# Patient Record
Sex: Male | Born: 1965 | Race: White | Hispanic: No | Marital: Married | Smoking: Former smoker
Health system: Southern US, Community
[De-identification: ages and names within clinical notes are randomized; demographics above are authoritative.]

## PROBLEM LIST (undated history)

## (undated) DIAGNOSIS — I10 Essential (primary) hypertension: Secondary | ICD-10-CM

## (undated) DIAGNOSIS — N189 Chronic kidney disease, unspecified: Secondary | ICD-10-CM

## (undated) DIAGNOSIS — T7840XA Allergy, unspecified, initial encounter: Secondary | ICD-10-CM

## (undated) DIAGNOSIS — M1990 Unspecified osteoarthritis, unspecified site: Secondary | ICD-10-CM

## (undated) DIAGNOSIS — I499 Cardiac arrhythmia, unspecified: Secondary | ICD-10-CM

## (undated) HISTORY — DX: Unspecified osteoarthritis, unspecified site: M19.90

## (undated) HISTORY — DX: Chronic kidney disease, unspecified: N18.9

## (undated) HISTORY — PX: CYSTOSCOPY: SUR368

## (undated) HISTORY — DX: Allergy, unspecified, initial encounter: T78.40XA

## (undated) HISTORY — PX: LITHOTRIPSY: SUR834

---

## 1998-08-13 ENCOUNTER — Emergency Department (HOSPITAL_COMMUNITY): Admission: EM | Admit: 1998-08-13 | Discharge: 1998-08-13 | Payer: Self-pay | Admitting: *Deleted

## 1999-01-15 ENCOUNTER — Encounter: Payer: Self-pay | Admitting: Emergency Medicine

## 1999-01-15 ENCOUNTER — Emergency Department (HOSPITAL_COMMUNITY): Admission: EM | Admit: 1999-01-15 | Discharge: 1999-01-15 | Payer: Self-pay | Admitting: Emergency Medicine

## 2000-01-01 ENCOUNTER — Ambulatory Visit (HOSPITAL_COMMUNITY): Admission: RE | Admit: 2000-01-01 | Discharge: 2000-01-01 | Payer: Self-pay | Admitting: Urology

## 2000-01-01 ENCOUNTER — Encounter: Payer: Self-pay | Admitting: Urology

## 2002-08-14 ENCOUNTER — Emergency Department (HOSPITAL_COMMUNITY): Admission: EM | Admit: 2002-08-14 | Discharge: 2002-08-15 | Payer: Self-pay | Admitting: Emergency Medicine

## 2002-08-19 ENCOUNTER — Encounter: Admission: RE | Admit: 2002-08-19 | Discharge: 2002-08-19 | Payer: Self-pay | Admitting: Urology

## 2002-08-19 ENCOUNTER — Encounter: Payer: Self-pay | Admitting: Urology

## 2002-08-22 ENCOUNTER — Encounter: Payer: Self-pay | Admitting: Emergency Medicine

## 2002-08-22 ENCOUNTER — Emergency Department (HOSPITAL_COMMUNITY): Admission: EM | Admit: 2002-08-22 | Discharge: 2002-08-22 | Payer: Self-pay | Admitting: Emergency Medicine

## 2002-10-09 ENCOUNTER — Encounter: Admission: RE | Admit: 2002-10-09 | Discharge: 2002-10-09 | Payer: Self-pay | Admitting: Urology

## 2002-10-09 ENCOUNTER — Encounter: Payer: Self-pay | Admitting: Urology

## 2002-10-13 ENCOUNTER — Ambulatory Visit (HOSPITAL_COMMUNITY): Admission: RE | Admit: 2002-10-13 | Discharge: 2002-10-13 | Payer: Self-pay | Admitting: Urology

## 2004-07-19 ENCOUNTER — Emergency Department (HOSPITAL_COMMUNITY): Admission: EM | Admit: 2004-07-19 | Discharge: 2004-07-19 | Payer: Self-pay | Admitting: Emergency Medicine

## 2006-05-16 ENCOUNTER — Emergency Department (HOSPITAL_COMMUNITY): Admission: EM | Admit: 2006-05-16 | Discharge: 2006-05-16 | Payer: Self-pay | Admitting: Emergency Medicine

## 2006-10-04 ENCOUNTER — Emergency Department (HOSPITAL_COMMUNITY): Admission: EM | Admit: 2006-10-04 | Discharge: 2006-10-04 | Payer: Self-pay | Admitting: Emergency Medicine

## 2007-07-21 ENCOUNTER — Emergency Department: Payer: Self-pay | Admitting: Emergency Medicine

## 2007-08-04 ENCOUNTER — Emergency Department (HOSPITAL_COMMUNITY): Admission: EM | Admit: 2007-08-04 | Discharge: 2007-08-04 | Payer: Self-pay | Admitting: Emergency Medicine

## 2010-09-11 ENCOUNTER — Emergency Department: Payer: Self-pay | Admitting: Emergency Medicine

## 2011-03-16 NOTE — Op Note (Signed)
[place]  Patient:    Gordon Stark, Gordon Stark                       MRN: [id number] Proc. Date: 01/01/00 Adm. Date:  [id number] Attending:  Lauree Chandler                           Operative Report  PREOPERATIVE DIAGNOSIS:  Distal right ureteral stone.  POSTOPERATIVE DIAGNOSIS:  Distal right ureteral stone.  PROCEDURE:  Cystoscopy, right ureteroscopic stone manipulation, right retrograde pyelogram with interpretation, insertion of right double-J catheter.  SURGEON:  Maretta Bees. Vonita Moss, M.D.  ANESTHESIA:  General.  INDICATIONS:  This is a 45 year old gentleman, who had past history of stones and now has had recurrent right flank pain for several weeks.  Because of his persistent symptomatology, he wanted [place] to go ahead and intervene.  He had an IVP on December 27, 1999, which showed mild caliectasis and what appeared to be a faint 3-4 mm stone in the mid bony pelvis.  Preoperative x-ray today was read as negative by the radiologist, but I looked at the x-ray and found faint opacity that I thought equated to the stone I saw on x-ray before.  PROCEDURE:  The patient was brought to the operating room and placed in the lithotomy position.  External genitalia were prepped and draped in usual fashion. He was cystoscoped and the bladder was unremarkable.  I passed a guidewire up the right ureter and fluoroscopically could see the stone adjacent to the wire and moving with the wire.  I then did a balloon dilation of the intramural ureter with a guidewire in place, I inserted the 6-French short, rigid ureteroscope and was  able to visualize the stone.  It was fairly impacted.  On the third attempt to grasp the stone with the [facility] stone basket, I was successful and removed the stone intact and it correlated with the 3-4 mm suspected calcification on KUB and I later gave the stone to the family.  I then inserted a ureteral catheter and performed  a right retrograde pyelogram that showed the ureter to be intact with  some mild caliectasis.  I then inserted a 6-French/26 cm double-J catheter with a full coil in the renal pelvis and full coil in the bladder and the string brought out per urethra.  The string was taped to the penis and the patient sent to the  recovery room in good condition, having tolerated the procedure well. DD:  01/01/00 TD:  01/01/00 Job: [id number]

## 2011-03-16 NOTE — Op Note (Signed)
Gordon Stark, Gordon Stark                          ACCOUNT NO.:  [id number]   MEDICAL RECORD NO.:  [id number]                   PATIENT TYPE:  AMB   LOCATION:  DAY                                  FACILITY:  [facility]   PHYSICIAN:  Maretta Bees. Vonita Moss, M.D.             DATE OF BIRTH:  21-Nov-1965   DATE OF PROCEDURE:  10/28/2002  DATE OF DISCHARGE:                                 OPERATIVE REPORT   PREOPERATIVE DIAGNOSIS:  Distal left ureteral calculus.   POSTOPERATIVE DIAGNOSIS:  Distal left ureteral calculus.   PROCEDURE:  Cystoscopy  left retrograde pyelogram interpretation and left  ureteroscopic basketing and removal.   SURGEON:  Maretta Bees. Vonita Moss, M.D.   ANESTHESIA:  General.   INDICATIONS:  This 45 year old white male has had over six weeks of pain due  to a stone in the left ureter that started out at the [facility] and finally had a  recent CAT scan and it was down in the distal left ureter.  The stone on the  recent CT scan measured 2 mm.  The stone on the recent CT scan measured 2  mm.  There was also a tiny lower pole calculi in each kidney.  Because of  persistent symptoms, the patient wished endoscopic removal.   DESCRIPTION OF PROCEDURE:  The patient was brought to the operating room and  placed in the lithotomy position.  External genitalia were prepped and  draped in the usual fashion.  With the cystoscope, the bladder was normal.  The guidewire was easily passed up the left ureter.  I inserted the 6 French  rigid ureteroscope just inside the ureteral orifice and then there was some  snugness in the ureter and therefore I used a ureteral dilating sheath to  dilate the intramural ureter which then allowed the ureteroscope to easily  pass under visual control, and I encountered the stone fragment which was  then broken in two pieces and each piece was retrieved with the Nitinol  stone basket.  They were later retrieved from the bladder and given to the  patient.  In the  meantime, the distal ureter was rescoped, and there was no  evidence of residual stone fragment and no evidence of perforation or any  significant injury to the distal ureter.  I injected contrast in the upper  ureter.  It was intact with no obstruction.  I elected not to put in a  double-J catheter at this time.  As mentioned above, we then placed the  scope and removed the stones and drained the bladder.  He was given 30 mg of  Toradol, B&O suppository and Xylocaine jelly was injected per urethra for  postoperative analgesia.  He was then taken to the recovery room in good  condition.  Maretta Bees. Vonita Moss, M.D.    LJP/MEDQ  D:  10/13/2002  T:  10/13/2002  Job:  [id number]

## 2012-03-18 ENCOUNTER — Other Ambulatory Visit: Payer: Self-pay | Admitting: Urology

## 2012-04-16 ENCOUNTER — Encounter (HOSPITAL_COMMUNITY): Payer: Self-pay | Admitting: Pharmacy Technician

## 2012-04-21 ENCOUNTER — Encounter (HOSPITAL_COMMUNITY): Payer: Self-pay | Admitting: *Deleted

## 2012-04-22 ENCOUNTER — Encounter (HOSPITAL_COMMUNITY): Payer: Self-pay | Admitting: *Deleted

## 2012-04-23 NOTE — H&P (Signed)
History of Present Illness         F/u nephrolithiasis. Pt presented Feb 2013 with acute right flank pain. He has known multiple bilateral renal stones as documented on CTU 2010.  He passed a small stone spontaneously 03/2011.  He has required stone extraction procedures and ESWL in the past.  Interval Hx  CT of the abdomen and pelvis February 2013 revealed a 1 cm right lower pole stone and a 7 mm left midpole and a 2 mm left lower pole stone. There was no hydroureteronephrosis.  Patient continued surveillance and returns today. He has been considering shock wave lithotripsy.   He feels he is passing a stone. He has left flank pain. His UA shows TNTC rbc's.  KUB today - normal bowel gas. Comparison to CT Feb 2013, 1 cm RLP stone, 7 mm LMP stone. No stone in area of ureter or bladder.   Past Medical History Problems  1. History of  Esophageal Reflux 530.81 2. History of  Hypertension 401.9 3. History of  Rhythm Disorder 427.9 4. History of  Rosacea 695.3  Surgical History Problems  1. History of  Cystoscopy With Ureteroscopy 2. History of  Lithotripsy  Current Meds 1. Chlorthalidone 25 MG Oral Tablet; Therapy: 11Nov2012 to 2. Doxycycline Monohydrate CAPS; Therapy: (Recorded:13May2010) to 3. Oxycodone-Acetaminophen 5-325 MG Oral Tablet; Take 1 to 2 tabs q 3 to 4  hours prn pain;  Therapy: 13May2010 to (Last Rx:01Feb2013) 4. Pepcid TABS; Therapy: (Recorded:13May2010) to  Allergies Medication  1. Salicylates  Family History Problems  1. Family history of  Nephrolithiasis  Social History Problems  1. Alcohol Use rarely 2. Marital History - Currently Married 3. Occupation: Research officer, political party 4. History of  Tobacco Use V15.82 Smoked on weekends only and quit 2 yrs ago Denied  5. History of  Caffeine Use  Review of Systems Constitutional, skin, eye, otolaryngeal, hematologic/lymphatic, cardiovascular, pulmonary, endocrine, musculoskeletal, gastrointestinal, neurological and  psychiatric system(s) were reviewed and pertinent findings if present are noted.    Vitals Vital Signs [Data Includes: Last 1 Day]  20May2013 03:50PM  Blood Pressure: 125 / 90 Temperature: 98.2 F Heart Rate: 87  Physical Exam Constitutional: Well nourished and well developed . No acute distress.  Pulmonary: No respiratory distress and normal respiratory rhythm and effort.  Cardiovascular: Heart rate and rhythm are normal . No peripheral edema.  Neuro/Psych:. Mood and affect are appropriate.    Results/Data Urine [Data Includes: Last 1 Day]   20May2013  COLOR YELLOW   APPEARANCE CLEAR   SPECIFIC GRAVITY 1.025   pH 5.5   GLUCOSE NEG mg/dL  BILIRUBIN NEG   KETONE NEG mg/dL  BLOOD LARGE   PROTEIN NEG mg/dL  UROBILINOGEN 0.2 mg/dL  NITRITE NEG   LEUKOCYTE ESTERASE NEG   SQUAMOUS EPITHELIAL/HPF RARE   WBC NONE SEEN WBC/hpf  RBC TNTC RBC/hpf  BACTERIA RARE   CRYSTALS NONE SEEN   CASTS NONE SEEN    Assessment Assessed  1. 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 Hydrocodone-Acetaminophen 5-325 MG Oral Tablet; TAKE 1 TO 2 TABLETS EVERY 4 TO 6  HOURS AS NEEDED FOR PAIN.  NO MORE THAN 8 TABLETS PER DAY; Therapy: 20May2013 to  (Evaluate:25May2013); Last Rx:20May2013 2. Tamsulosin HCl 0.4 MG Oral Capsule; TAKE 1 CAPSULE Daily; Therapy: 20May2013 to  (Evaluate:17Sep2013); Last Rx:20May2013 3. KUB  Done: 20May2013 12:00AM 4. Follow-up Schedule Surgery Office  Follow-up  Requested for: 20May2013  Discussion/Summary  He's likely passing one of the small LLP stones seen on CT. He will restart tamsulosin. He was given pain meds.   We discussed the larger stones are relatively stable. We discussed the nature, R/B of continued surveillance, ESWL , URS or PCNL.   The patient is concerned because he has a 45 yo at home and his wife works at night. He has his son by himself. Also they want to do some traveling and he is concerned  about passing a stone. He doesnt want to have to pass the stone. He elects to proceed with ESWL and start with the right. We discussed this is a large stone and he may need multiple procedures to clear it and/or the fragments.       Signatures Electronically signed by : Jerilee Field, M.D.; Mar 17 2012  4:55PM

## 2012-04-24 ENCOUNTER — Ambulatory Visit (HOSPITAL_COMMUNITY): Payer: Federal, State, Local not specified - PPO

## 2012-04-24 ENCOUNTER — Encounter (HOSPITAL_COMMUNITY): Payer: Self-pay | Admitting: *Deleted

## 2012-04-24 ENCOUNTER — Encounter (HOSPITAL_COMMUNITY)
Admission: RE | Disposition: A | Discharge status: Home / Self Care | Payer: Self-pay | Source: Ambulatory Visit | Attending: Urology

## 2012-04-24 ENCOUNTER — Ambulatory Visit (HOSPITAL_COMMUNITY)
Admission: RE | Admit: 2012-04-24 | Discharge: 2012-04-24 | Disposition: A | Discharge status: Home / Self Care | Payer: Federal, State, Local not specified - PPO | Source: Ambulatory Visit | Attending: Urology | Admitting: Urology

## 2012-04-24 DIAGNOSIS — Z79899 Other long term (current) drug therapy: Secondary | ICD-10-CM | POA: Insufficient documentation

## 2012-04-24 DIAGNOSIS — I10 Essential (primary) hypertension: Secondary | ICD-10-CM | POA: Insufficient documentation

## 2012-04-24 DIAGNOSIS — I499 Cardiac arrhythmia, unspecified: Secondary | ICD-10-CM | POA: Insufficient documentation

## 2012-04-24 DIAGNOSIS — K219 Gastro-esophageal reflux disease without esophagitis: Secondary | ICD-10-CM | POA: Insufficient documentation

## 2012-04-24 DIAGNOSIS — N200 Calculus of kidney: Secondary | ICD-10-CM | POA: Insufficient documentation

## 2012-04-24 HISTORY — DX: Cardiac arrhythmia, unspecified: I49.9

## 2012-04-24 HISTORY — DX: Essential (primary) hypertension: I10

## 2012-04-24 SURGERY — LITHOTRIPSY, ESWL
Anesthesia: LOCAL | Laterality: Right

## 2012-04-24 MED ORDER — CIPROFLOXACIN HCL 500 MG PO TABS
500.0000 mg | ORAL_TABLET | ORAL | Status: AC
Start: 2012-04-24 — End: 2012-04-24
  Administered 2012-04-24: 500 mg via ORAL
  Filled 2012-04-24: qty 1

## 2012-04-24 MED ORDER — CIPROFLOXACIN HCL 500 MG PO TABS: 250.0000 mg | ORAL_TABLET | Freq: Two times a day (BID) | ORAL | Status: AC

## 2012-04-24 MED ORDER — OXYCODONE-ACETAMINOPHEN 5-325 MG PO TABS: 1.0000 | ORAL_TABLET | ORAL | Status: AC | PRN

## 2012-04-24 MED ORDER — DIAZEPAM 5 MG PO TABS
10.0000 mg | ORAL_TABLET | ORAL | Status: AC
  Administered 2012-04-24: 10 mg via ORAL

## 2012-04-24 MED ORDER — DIPHENHYDRAMINE HCL 25 MG PO CAPS
25.0000 mg | ORAL_CAPSULE | ORAL | Status: AC
  Administered 2012-04-24: 25 mg via ORAL

## 2012-04-24 MED ORDER — DEXTROSE-NACL 5-0.45 % IV SOLN
INTRAVENOUS | Status: DC
  Administered 2012-04-24: 11:00:00 via INTRAVENOUS

## 2012-04-24 NOTE — Progress Notes (Signed)
Pt denies aspirin usage last 3 days

## 2012-04-24 NOTE — Brief Op Note (Signed)
04/24/2012  1:20 PM  PATIENT:  Gordon Stark  46 y.o. male  PRE-OPERATIVE DIAGNOSIS:  Right Nephrolithiasis  POST-OPERATIVE DIAGNOSIS:  Right nephrolithiasis  PROCEDURE:  Procedure(s) (LRB): EXTRACORPOREAL SHOCK WAVE LITHOTRIPSY (ESWL) (Right)  SURGEON:  Surgeon(s) and Role:    * Antony Haste, MD - Primary   PLAN OF CARE: Discharge to home after PACU  PATIENT DISPOSITION:  PACU - hemodynamically stable.

## 2012-04-24 NOTE — Interval H&P Note (Signed)
History and Physical Interval Note:  04/24/2012 12:55 PM  Gordon Stark  has presented today for surgery, with the diagnosis of Right Nephrolithiasis  The various methods of treatment have been discussed with the patient and family. After consideration of risks, benefits and other options for treatment, the patient has consented to  Procedure(s) (LRB): EXTRACORPOREAL SHOCK WAVE LITHOTRIPSY (ESWL) (Right) as a surgical intervention .  The patient's history has been reviewed, patient examined, no change in status, stable for surgery.  I have reviewed the patients' chart and labs.  Questions were answered to the patient's satisfaction.     Antony Haste

## 2012-04-24 NOTE — Discharge Instructions (Signed)
Lithotripsy for Kidney Stones  WHAT ARE KIDNEY STONES?  The kidneys filter blood for chemicals the body cannot use. These waste chemicals are eliminated in the urine. They are removed from the body. Under some conditions, these chemicals may become concentrated. When this happens, they form crystals in the urine. When these crystals build up and stick together, stones may form. When these stones block the flow of urine through the urinary tract, they may cause severe pain. The urinary tract is very sensitive to blockage and stretching by the stone.  WHAT IS LITHOTRIPSY?  Lithotripsy is a treatment that can sometimes help eliminate kidney stones and pain faster. A form of lithotripsy, also known as ESWL (extracorporeal shock wave lithotripsy), is a nonsurgical procedure that helps your body rid itself of the kidney stone with a minimum amount of pain. EWSL is a method of crushing a kidney stone with shock waves. These shock waves pass through your body. They cause the kidney stones to crumble while still in the urinary tract. It is then easier for the smaller pieces of stone to pass in the urine.  Lithotripsy usually takes about an hour. It is done in a hospital, a lithotripsy center, or a mobile unit. It usually does not require an overnight stay. Your caregiver will instruct you on preparation for the procedure. Your caregiver will tell you what to expect afterward.  LET YOUR CAREGIVER KNOW ABOUT:   Allergies.    Medicines taken including herbs, eye drops, over the counter medicines (including aspirin, aleve, or motrin for treatment of inflammatory conditions) and creams.    Use of steroids (by mouth or creams).    Previous problems with anesthetics or novocaine.    Possibility of pregnancy, if this applies.    History of blood clots (thrombophlebitis).    History of bleeding or blood problems.    Previous surgery.    Other health problems.   RISKS AND COMPLICATIONS   Complications of lithotripsy are uncommon, but include the following:   Infection.    Bleeding of the kidney.    Bruising of the kidney or skin.    Obstruction of the ureter (the passageway from the kidney to the bladder).    Failure of the stone to fragment (break apart).   PROCEDURE  A stent (flexible tube with holes) may be placed in your ureter. The ureter is the tube that transports the urine from the kidneys to the bladder. Your caregiver may place a stent before the procedure. This will help keep urine flowing from the kidney if the fragments of the stone block the ureter. You may receive an intravenous (IV) line to give you fluids and medicines. These medicines may help you relax or make you sleep. During the procedure, you will lie comfortably on a fluid-filled cushion or in a warm-water bath. After an x-ray or ultrasound locates your stone, shock waves are aimed at the stone. If you are awake, you may feel a tapping sensation (feeling) as the shock waves pass through your body. If large stone particles remain after treatment, a second procedure may be necessary at a later date.  For comfort during the test:   Relax as much as possible.    Try to remain still as much as possible.    Try to follow instructions to speed up the test.    Let your caregiver know if you are uncomfortable, anxious, or in pain.   AFTER THE PROCEDURE    After surgery, you will be   taken to the recovery area. A nurse will watch and check your progress. Once you're awake, stable, and taking fluids well, you will be allowed to go home as long as there are no problems. You may be prescribed antibiotics (medicines that kill germs) to help prevent infection. You may also be prescribed pain medicine if needed. In a week or two, your doctor may remove your stent, if you have one. Your caregiver will check to see whether or not stone particles remain.  PASSING THE STONE   It may take anywhere from a day to several weeks for the stone particles to leave your body. During this time, drink at least 8 to 12 eight ounce glasses of water every day. It is normal for your urine to be cloudy or slightly bloody for a few weeks following this procedure. You may even see small pieces of stone in your urine. A slight fever and some pain are also normal. Your caregiver may ask you to strain your urine to collect some stone particles for chemical analysis. If you find particles while straining the urine, save them. Analysis tells you and the caregiver what the stone is made of. Knowing this may help prevent future stones.  PREVENTING FUTURE STONES   Drink about 8 to 12, eight-ounce glasses of water every day.    Follow the diet your caregiver recommends.    Take your prescribed medicine.    See your caregiver regularly for checkups.   SEEK IMMEDIATE MEDICAL CARE IF:   You develop an oral temperature above 102 F (38.9 C), or as your caregiver suggests.    Your pain is not relieved by medicine.    You develop nausea (feeling sick to your stomach) and vomiting.    You develop heavy bleeding.    You have difficulty urinating.   Document Released: 10/12/2000 Document Revised: 10/04/2011 Document Reviewed: 08/06/2008  [employer] Patient Information 2012 [employer].

## 2012-04-25 ENCOUNTER — Encounter (HOSPITAL_COMMUNITY): Payer: Self-pay

## 2012-06-06 ENCOUNTER — Other Ambulatory Visit: Payer: Self-pay | Admitting: Family Medicine

## 2012-07-18 ENCOUNTER — Telehealth: Payer: Self-pay

## 2012-07-18 MED ORDER — CHLORTHALIDONE 25 MG PO TABS: 25.0000 mg | ORAL_TABLET | Freq: Every day | ORAL | Status: DC

## 2012-07-18 NOTE — Telephone Encounter (Signed)
PT NEEDS TO COME IN FOR A REFILL ON HIS BP MED, BUT CANT COME IN UNTIL NEXT Thursday.  HE WANTS TO KNOW IF HE CAN GET A WEEKS WORTH OF HIS BP MEDICINE UNTIL THEN.  [phone]

## 2012-07-18 NOTE — Telephone Encounter (Signed)
Called pt to advise, he is coming in next week. Rx sent in for him.

## 2012-12-03 ENCOUNTER — Ambulatory Visit: Payer: PRIVATE HEALTH INSURANCE

## 2012-12-03 ENCOUNTER — Ambulatory Visit (INDEPENDENT_AMBULATORY_CARE_PROVIDER_SITE_OTHER): Payer: PRIVATE HEALTH INSURANCE | Admitting: Family Medicine

## 2012-12-03 ENCOUNTER — Other Ambulatory Visit: Payer: Self-pay | Admitting: Family Medicine

## 2012-12-03 VITALS — BP 141/94 | HR 66 | Temp 99.3°F | Resp 16 | Ht 70.5 in | Wt 265.0 lb

## 2012-12-03 DIAGNOSIS — R112 Nausea with vomiting, unspecified: Secondary | ICD-10-CM

## 2012-12-03 DIAGNOSIS — I10 Essential (primary) hypertension: Secondary | ICD-10-CM

## 2012-12-03 LAB — COMPREHENSIVE METABOLIC PANEL
Albumin: 4 g/dL (ref 3.5–5.2)
Alkaline Phosphatase: 57 U/L (ref 39–117)
Glucose, Bld: 105 mg/dL — ABNORMAL HIGH (ref 70–99)
Potassium: 3.7 mEq/L (ref 3.5–5.3)
Sodium: 138 mEq/L (ref 135–145)
Total Protein: 6.5 g/dL (ref 6.0–8.3)

## 2012-12-03 LAB — POCT CBC
Granulocyte percent: 75.1 %G (ref 37–80)
HCT, POC: 47.1 % (ref 43.5–53.7)
Hemoglobin: 15.5 g/dL (ref 14.1–18.1)
POC Granulocyte: 4.2 (ref 2–6.9)
POC LYMPH PERCENT: 18.2 %L (ref 10–50)
RDW, POC: 13.4 %

## 2012-12-03 MED ORDER — ONDANSETRON 4 MG PO TBDP
8.0000 mg | ORAL_TABLET | Freq: Once | ORAL | Status: AC
  Administered 2012-12-03: 8 mg via ORAL

## 2012-12-03 MED ORDER — ONDANSETRON HCL 8 MG PO TABS: 8.0000 mg | ORAL_TABLET | Freq: Three times a day (TID) | ORAL | Status: DC | PRN

## 2012-12-03 MED ORDER — CHLORTHALIDONE 25 MG PO TABS: 25.0000 mg | ORAL_TABLET | Freq: Every day | ORAL | Status: DC

## 2012-12-03 NOTE — Progress Notes (Signed)
[facility] [address] [place] [address] [phone]  Date:  12/03/2012   Name:  Gordon Stark   DOB:  [birth year]   MRN:  [id number]  PCP:  Abbe Amsterdam, MD    Chief Complaint: Abdominal Pain, Diarrhea and Emesis   History of Present Illness:  Gordon Stark is a 47 y.o. very pleasant male patient who presents with the following:  He is here today with illness- at about 0130 yesterday am he noted onset of chills and diarrhea.  He then started to vomit a few hours later.  The vomiting stopped about noon yesterday, as did the diarrhea.  He tried imodium.  He does note epigastric abdominal pain.  He vomited about 6 times overall- there was a small amount of bloody mucus towards the end.  He has tried to eat a few crackers, but continues to have pain.   He had felt warm yesterday at home, but they did not check a temperature.  His 52 year old son was ill with a similar problem last week, and some friends who he golfed with over the weekend had a GI bug as well earlier this week.  No cough, ST or earache.    He has been out of his chlorthalidone for a couple of months and needs a refill  There is no problem list on file for this patient.   Past Medical History  Diagnosis Date  . Hypertension   . Dysrhythmia   . Allergy     Past Surgical History  Procedure Date  . Lithotripsy     75yrs ago  . Cystoscopy     6 times    History  Substance Use Topics  . Smoking status: Never Smoker   . Smokeless tobacco: Current User    Types: Chew  . Alcohol Use: Yes     Comment: 12 pack beer a week    No family history on file.  Allergies  Allergen Reactions  . Salicylates Other (See Comments)    Lips swell so bad they can bust    Medication list has been reviewed and updated.  Current Outpatient Prescriptions on File Prior to Visit  Medication Sig Dispense Refill  . diphenhydrAMINE (BENADRYL) 25 MG tablet Take 25 mg by mouth 2 (two) times  daily.      Marland Kitchen doxycycline (VIBRA-TABS) 100 MG tablet Take 100 mg by mouth 2 (two) times daily.      . famotidine (PEPCID) 20 MG tablet Take 20 mg by mouth 2 (two) times daily.      . chlorthalidone (HYGROTON) 25 MG tablet Take 1 tablet (25 mg total) by mouth daily. NEEDS OFFICE VISIT/LABS FOR MORE  30 tablet  0  . HYDROcodone-acetaminophen (NORCO) 5-325 MG per tablet Take 1-2 tablets by mouth See admin instructions. Every 4-6 hours max of 8 tabs per day        Review of Systems:  As per HPI- otherwise negative.   Physical Examination: Filed Vitals:   12/03/12 0759  BP: 141/94  Pulse: 66  Temp: 99.3 F (37.4 C)  Resp: 16   Filed Vitals:   12/03/12 0759  Height: 5' 10.5" (1.791 m)  Weight: 265 lb (120.203 kg)   Body mass index is 37.49 kg/(m^2). Ideal Body Weight: Weight in (lb) to have BMI = 25: 176.4   GEN: WDWN, NAD, Non-toxic, A & O x 3, overweight HEENT: Atraumatic, Normocephalic. Neck supple. No masses, No LAD.  Bilateral TM wnl,  oropharynx normal.  PEERL,EOMI.   Ears and Nose: No external deformity. CV: RRR, No M/G/R. No JVD. No thrill. No extra heart sounds. PULM: CTA B, no wheezes, crackles, rhonchi. No retractions. No resp. distress. No accessory muscle use. ABD: S, ND, +BS. No rebound. No HSM. Mild tenderenss throughout the abdomen- not in any particular quadrant.  It moves around on re- exam.  No particular RLQ tenderness EXTR: No c/c/e NEURO Normal gait.  PSYCH: Normally interactive. Conversant. Not depressed or anxious appearing.  Calm demeanor.   [facility] reading (PRIMARY) by  Dr. Patsy Lager. abdominal series:  CXR shows elevation of right hemidiaphragm, otherwise normal Upright abdomen:? Few air fluid levels  ACUTE ABDOMEN SERIES (ABDOMEN 2 VIEW & CHEST 1 VIEW)  Comparison: 06/05/2012.  Findings: The upright chest x-ray demonstrates normal heart size. The mediastinal and hilar contours are normal. The lungs are clear. No pleural effusion.  Normal bowel gas  pattern. The soft tissue shadows of the abdomen are maintained. There are bilateral renal calculi. The bony structures are intact.  IMPRESSION:  1. No acute cardiopulmonary findings. 2. Bilateral renal calculi. 3. Normal bowel gas pattern.  He is already aware of the kidney stones Results for orders placed in visit on 12/03/12  POCT CBC      Component Value Range   WBC 5.6  4.6 - 10.2 K/uL   Lymph, poc 1.0  0.6 - 3.4   POC LYMPH PERCENT 18.2  10 - 50 %L   MID (cbc) 0.4  0 - 0.9   POC MID % 6.7  0 - 12 %M   POC Granulocyte 4.2  2 - 6.9   Granulocyte percent 75.1  37 - 80 %G   RBC 4.96  4.69 - 6.13 M/uL   Hemoglobin 15.5  14.1 - 18.1 g/dL   HCT, POC 96.0  45.4 - 53.7 %   MCV 95.0  80 - 97 fL   MCH, POC 31.3 (*) 27 - 31.2 pg   MCHC 32.9  31.8 - 35.4 g/dL   RDW, POC 09.8     Platelet Count, POC 184  142 - 424 K/uL   MPV 7.7  0 - 99.8 fL    Assessment and Plan: 1. Nausea & vomiting  ondansetron (ZOFRAN-ODT) disintegrating tablet 8 mg, POCT CBC, Comprehensive metabolic panel, DG Abd 2 Views, ondansetron (ZOFRAN) 8 MG tablet  2. HTN (hypertension)  chlorthalidone (HYGROTON) 25 MG tablet   Likely viral gastroenteritis.  8 mg of zofran ODT here in clinic helped a lot.  He will go home and rest, eat a bland diet.  Cautioned him to let me know if he starts to have any particular abdominal pain, especially in the RLQ as this could indicate appendicitis.  He agreed, will contact him when CMP available.    Refilled his BP medication as well  Keeton Kassebaum, MD

## 2012-12-03 NOTE — Patient Instructions (Addendum)
Use the nausea medication as needed- drink fluids and eat a bland diet such as crackers and toast.  If you do not continue to feel better today- or if you develop any particular abdominal pain- please call or come back in.  We may need to do a CT scan if you have pain or continued symtpoms

## 2012-12-04 ENCOUNTER — Encounter: Payer: Self-pay | Admitting: Family Medicine

## 2012-12-07 ENCOUNTER — Ambulatory Visit (INDEPENDENT_AMBULATORY_CARE_PROVIDER_SITE_OTHER): Payer: PRIVATE HEALTH INSURANCE | Admitting: Internal Medicine

## 2012-12-07 VITALS — BP 136/93 | HR 67 | Temp 98.1°F | Resp 18 | Ht 70.5 in | Wt 263.8 lb

## 2012-12-07 DIAGNOSIS — I10 Essential (primary) hypertension: Secondary | ICD-10-CM | POA: Insufficient documentation

## 2012-12-07 DIAGNOSIS — A088 Other specified intestinal infections: Secondary | ICD-10-CM

## 2012-12-07 NOTE — Progress Notes (Signed)
  Subjective:    Patient ID: Gordon Stark, male    DOB: 1966-07-08, 47 y.o.   MRN: [id number]  HPI Patient comes in today following up. Pt was seen on Wednesday with Dr. Patsy Lager for diarrhea and Nausea. Fever to 102.  He was diagnosed with a GI Virus. Thursday after taking prescriptions, he felt better and was able to eat.  He has had a few bouts of blood in his tissues after blowing his nose. He also noticed a light blood red tinge to his urine. Patient has a history of kidney stones this is not the same. This only happened on Thursday evening once. Tried to eat sausage and beans  thurs pm and got abd pain, then on Friday his symptoms of diarrhea and nausea returned.These resolved Sat am and has been OK today tho no appetite.  He complains of rib pain with sneezing( from throwing up so frequently tues)  He complains of swelling in his feet bilaterally after taking the medication for nausea on Wednesday. He complains that the pain was significant enough to prevent his to walk. He was able to resolve the swelling with Benadryl. 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 These sxt resolved now   Review of Systems     Objective:   Physical Exam BP 136/93  Pulse 67  Temp(Src) 98.1 F (36.7 C) (Oral)  Resp 18  Ht 5' 10.5" (1.791 m)  Wt 263 lb 12.8 oz (119.659 kg)  BMI 37.3 kg/m2  SpO2 99% ENT clear Lungs clear Abd NT,ND-BS quiet       Assessment & Plan:  Viral gastroent resolving  Soft diet/small quan freq Resume work tomorrow

## 2013-07-08 IMAGING — CR DG ABDOMEN 1V
1 series · 1 of 1 positions shown · non-contrast
Comparison: Abdominal radiograph 03/17/2012.

CLINICAL DATA: Pre lithotripsy evaluation.

ABDOMEN - 1 VIEW

[t abdomen supine]
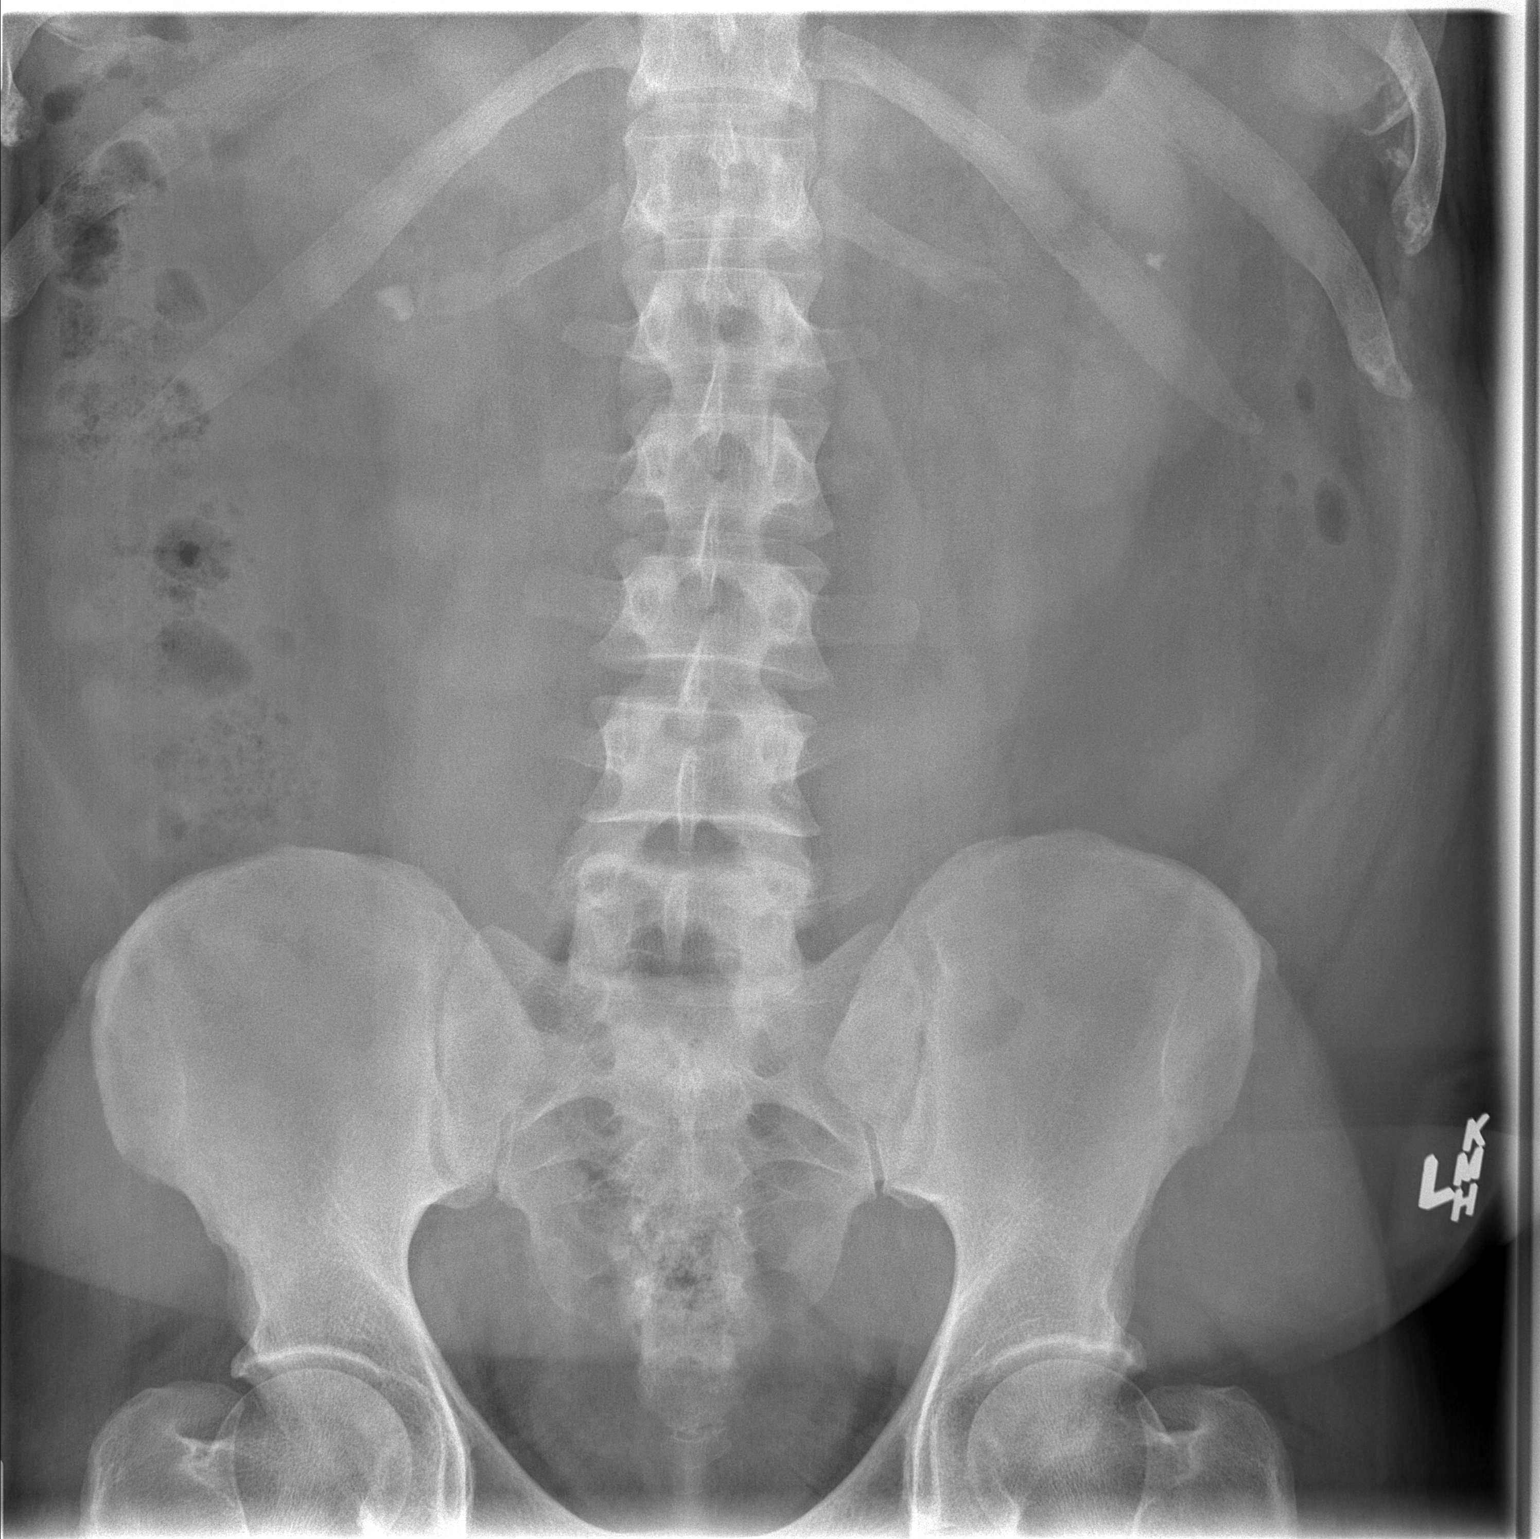

[1 of 1 positions shown; findings below may reference images not displayed]

FINDINGS: 11 mm calculus projects over the lower pole collecting
system of the right kidney.  Additionally, there is a 5 mm calculus
projecting over the interpolar region of the left kidney.  No
additional calculi project over the expected course of the ureters
bilaterally or within the visualized portions of the urinary
bladder.  Visualized bowel gas pattern is nonobstructive.
IMPRESSION: 1.  Bilateral nephrolithiasis, as above.

## 2013-12-08 ENCOUNTER — Other Ambulatory Visit: Payer: Self-pay | Admitting: Family Medicine

## 2013-12-21 ENCOUNTER — Ambulatory Visit (INDEPENDENT_AMBULATORY_CARE_PROVIDER_SITE_OTHER): Payer: PRIVATE HEALTH INSURANCE | Admitting: Internal Medicine

## 2013-12-21 VITALS — BP 122/90 | HR 80 | Temp 98.4°F | Resp 18 | Ht 70.5 in | Wt 267.2 lb

## 2013-12-21 DIAGNOSIS — I10 Essential (primary) hypertension: Secondary | ICD-10-CM

## 2013-12-21 DIAGNOSIS — R233 Spontaneous ecchymoses: Secondary | ICD-10-CM

## 2013-12-21 DIAGNOSIS — Z7189 Other specified counseling: Secondary | ICD-10-CM

## 2013-12-21 DIAGNOSIS — R509 Fever, unspecified: Secondary | ICD-10-CM

## 2013-12-21 DIAGNOSIS — M720 Palmar fascial fibromatosis [Dupuytren]: Secondary | ICD-10-CM

## 2013-12-21 DIAGNOSIS — R197 Diarrhea, unspecified: Secondary | ICD-10-CM

## 2013-12-21 DIAGNOSIS — R110 Nausea: Secondary | ICD-10-CM

## 2013-12-21 LAB — POCT CBC
Granulocyte percent: 63.8 %G (ref 37–80)
HCT, POC: 51.8 % (ref 43.5–53.7)
Hemoglobin: 16.4 g/dL (ref 14.1–18.1)
Lymph, poc: 1.5 (ref 0.6–3.4)
MCH: 31 pg (ref 27–31.2)
MCHC: 31.7 g/dL — AB (ref 31.8–35.4)
MCV: 98 fL — AB (ref 80–97)
MID (CBC): 0.4 (ref 0–0.9)
MPV: 8.3 fL (ref 0–99.8)
PLATELET COUNT, POC: 171 10*3/uL (ref 142–424)
POC Granulocyte: 3.2 (ref 2–6.9)
POC LYMPH %: 29.1 % (ref 10–50)
POC MID %: 7.1 % (ref 0–12)
RBC: 5.29 M/uL (ref 4.69–6.13)
RDW, POC: 13.3 %
WBC: 5 10*3/uL (ref 4.6–10.2)

## 2013-12-21 MED ORDER — HYDROCODONE-ACETAMINOPHEN 7.5-325 MG/15ML PO SOLN: 5.0000 mL | Freq: Four times a day (QID) | ORAL | Status: DC | PRN

## 2013-12-21 MED ORDER — CHLORTHALIDONE 25 MG PO TABS: 25.0000 mg | ORAL_TABLET | Freq: Every day | ORAL | Status: DC

## 2013-12-21 NOTE — Progress Notes (Signed)
   Subjective:    Patient ID: Gordon Stark, male    DOB: Mar 25, 1966, 48 y.o.   MRN: [id number]  HPI 48 y.o. Male presents to clinic with headache and diarrhea that started at noon yesterday. Reports having chills, body aches, fever. Reports that his 47 year old had a stomach bug last week. States that fever broke at about 2 am. Took a leftover hydrocodone from kidney stones in the past for relief. States that it eased the aches to the point he could sleep. Has been taking sprite, seven up and Gatorade but is afraid to eat anything. States that it feels funny in his stomach.Denies seeing any blood in his stool. No vomiting and having a stool  About every 2-3 hrs.  Reports needing refills on BP medications. Also c/o deformity in palm of each hand with left hand starting this year. No Fhx of contractures  His child was sick with same illness last week  Review of Systems     Objective:   Physical Exam  Vitals reviewed. Constitutional: He is oriented to person, place, and time. He appears well-developed and well-nourished. No distress.  HENT:  Right Ear: External ear normal.  Left Ear: External ear normal.  Nose: Nose normal.  Mouth/Throat: Oropharynx is clear and moist.  Eyes: EOM are normal. Pupils are equal, round, and reactive to light.  Neck: Normal range of motion. Neck supple.  Cardiovascular: Normal rate, regular rhythm and normal heart sounds.   Pulmonary/Chest: Effort normal and breath sounds normal.  Abdominal: Soft. Bowel sounds are normal. He exhibits no mass. There is no tenderness.  Musculoskeletal:       Right hand: He exhibits decreased range of motion. He exhibits no tenderness. Normal sensation noted. Normal strength noted.       Left hand: He exhibits decreased range of motion. He exhibits no tenderness and no bony tenderness. Normal sensation noted. Normal strength noted.       Hands: Bilateral Duputyrens contractures  Lymphadenopathy:    He has no cervical  adenopathy.  Neurological: He is alert and oriented to person, place, and time. No cranial nerve deficit. He exhibits normal muscle tone. Coordination normal.  Skin: No rash noted. He is not diaphoretic.  Psychiatric: He has a normal mood and affect. His behavior is normal. Thought content normal.    Results for orders placed in visit on 12/21/13  POCT CBC      Result Value Ref Range   WBC 5.0  4.6 - 10.2 K/uL   Lymph, poc 1.5  0.6 - 3.4   POC LYMPH PERCENT 29.1  10 - 50 %L   MID (cbc) 0.4  0 - 0.9   POC MID % 7.1  0 - 12 %M   POC Granulocyte 3.2  2 - 6.9   Granulocyte percent 63.8  37 - 80 %G   RBC 5.29  4.69 - 6.13 M/uL   Hemoglobin 16.4  14.1 - 18.1 g/dL   HCT, POC 40.951.8  81.143.5 - 53.7 %   MCV 98.0 (*) 80 - 97 fL   MCH, POC 31.0  27 - 31.2 pg   MCHC 31.7 (*) 31.8 - 35.4 g/dL   RDW, POC 91.413.3     Platelet Count, POC 171  142 - 424 K/uL   MPV 8.3  0 - 99.8 fL         Assessment & Plan:  Fever/Body aches/Norovirus HTN/RF meds/Bmet Duputyrens contractures progressive/Refer to hand ortho

## 2013-12-21 NOTE — Patient Instructions (Signed)
Dupuytren's Contracture Dupuytren's contracture affects the fingers and the palm of the hand. This condition usually develops slowly. It may take many years to develop. The pinky finger and the ring finger are most often affected. These fingers start to curve inward, like a claw. At some point, the fingers cannot go straight anymore. This can make it hard to do things like:  Put on gloves.  Shake hands.  Grab something off a shelf. The condition usually does not cause pain and is not dangerous. The condition gets its name from the doctor who came up with an operation to fix the problem. His name was Baron Guillaume Dupuytren. Contracture means pulling inward. CAUSES  Dupuytren's contracture does not start with the fingers. It starts in the palm of the hand, under the skin. The tissue under the skin is called fascia. The fascia covers the cords (tendons) that control how the fingers move. In Dupuytren's contracture the fascia tissue becomes thick and then pulls on the cords. That causes the fingers to curl. The condition can affect both hands and any fingers, but it usually strikes one hand worse than the other. The fingers farthest from the thumb are most often the ones that curl. The cause is not clear. Some experts believe it results from an autoimmune reaction. That means the body's immune system (which fights off disease) attacks itself by mistake. What experts do know is that certain conditions and behaviors (called risk factors) make the chance of having this condition more likely. They include:  Age. Most people who have the condition are older than 50.  Sex. It affects men more often than women.  Family history. The condition tends to run in families from countries in [place].  Certain behaviors. People who smoke and drink alcohol are more apt to develop the problem.  Some other medical conditions. Having diabetes makes Dupuytren's contracture more likely. So does  having a condition that involves a seizure (when the brain's function is interrupted). SYMPTOMS  Signs of this condition take time to develop. Sometimes this takes weeks or months. More often, it takes several years.   Early symptoms:  Skin on the palm of the hand becomes thick. This is usually the first sign.  The skin may look dimpled or puckered.  Lumps (nodules) show up on the palm. There may be one or more lumps. They are not painful.  Later symptoms:  Thick cords of tissue form in the palm of the hand.  The pinky and ring fingers start to curl up into the palm.  The fingers cannot be straightened into their normal position. DIAGNOSIS  A physical examination is the main way that a healthcare provider can tell if you have Dupuytren's contracture. Other tests usually are not needed. The caregiver will probably:  Look at your hands. Feel your hands. This is to check for thickening and nodules.  Measure finger motion. This tells how much your fingers have contracted (pulled in).  Do a tabletop test. You will be asked to try to put your hand flat on a table, palm down. TREATMENT  There is no cure for Dupuytren's contracture. But there are ways to treat the symptoms. Options include:  Watching and waiting. The condition develops slowly. Often it does not create problems for a long time. Sometimes the skin gets thick and nodules form, but the fingers never curl. So, in some cases it is best to just watch the condition carefully and wait to see what happens.    Shots (injections). Different substances may be injected, including:  Steroids. These drugs block swelling. These shots should make the condition less uncomfortable. Steroids may also slow down the condition. Shots are given into the nodules. The effect only lasts awhile. More shots may have to be given.  Enzymes. These are proteins. They weaken the thick tissue. After an injection, the caregiver usually stretches the  fingers.  Needling. A needle is pushed through the skin and into the thick tissue. This is done in several spots. The goal is to break up the thickened tissue. Or to weaken it.  Surgery. This may be suggested if you cannot grasp objects. Or, if you can no longer put your hand in your pocket.  A cut (incision) is made in the palm of the hand. The thick tissue is removed.  Sometimes the thick tissue is attached to the skin. Then, the skin must be removed, too. It is replaced with a piece of skin from another place on your body. That is called a skin graft.  Occupational or hand therapy is almost always needed after surgery. This involves special exercises to get back the use of your hand and fingers. After a skin graft, several months of therapy may be needed.  Sometimes the condition comes back, even after surgery.  Other methods. You can do some things on your own. They include:  Stretching the fingers backwards. Do this often.  Warming the hand and massaging it. Again, do this often.  Using tools with padded grips. This should make things easier.  Wearing heavy gloves while working. This protects the hands. PROGNOSIS  Dupuytren's contracture usually develops slowly. There is no cure. But, the symptoms can be treated. Sometimes they come back after treatment, but not always. It is important to remember that this is a functional problem and not a life-threatening condition. Document Released: 08/12/2009 Document Revised: 01/07/2012 Document Reviewed: 08/12/2009 [facility] Patient Information 2014 [place], [place]. Diet for Diarrhea, Adult Frequent, runny stools (diarrhea) may be caused or worsened by food or drink. Diarrhea may be relieved by changing your diet. Since diarrhea can last up to 7 days, it is easy for you to lose too much fluid from the body and become dehydrated. Fluids that are lost need to be replaced. Along with a modified diet, make sure you drink enough fluids to keep your  urine clear or pale yellow. DIET INSTRUCTIONS  Ensure adequate fluid intake (hydration): have 1 cup (8 oz) of fluid for each diarrhea episode. Avoid fluids that contain simple sugars or sports drinks, fruit juices, whole milk products, and sodas. Your urine should be clear or pale yellow if you are drinking enough fluids. Hydrate with an oral rehydration solution that you can purchase at pharmacies, retail stores, and online. You can prepare an oral rehydration solution at home by mixing the following ingredients together:    tsp table salt.   tsp baking soda.   tsp salt substitute containing potassium chloride.  1  tablespoons sugar.  1 L (34 oz) of water.  Certain foods and beverages may increase the speed at which food moves through the gastrointestinal (GI) tract. These foods and beverages should be avoided and include:  Caffeinated and alcoholic beverages.  High-fiber foods, such as raw fruits and vegetables, nuts, seeds, and whole grain breads and cereals.  Foods and beverages sweetened with sugar alcohols, such as xylitol, sorbitol, and mannitol.  Some foods may be well tolerated and may help thicken stool including:  Starchy foods, such  as rice, toast, pasta, low-sugar cereal, oatmeal, grits, baked potatoes, crackers, and bagels.   Bananas.   Applesauce.  Add probiotic-rich foods to help increase healthy bacteria in the GI tract, such as yogurt and fermented milk products. RECOMMENDED FOODS AND BEVERAGES Starches Choose foods with less than 2 g of fiber per serving.  Recommended:  White, [place], and pita breads, plain rolls, buns, bagels. Plain muffins, matzo. Soda, saltine, or graham crackers. Pretzels, melba toast, zwieback. Cooked cereals made with water: cornmeal, farina, cream cereals. Dry cereals: refined corn, wheat, rice. Potatoes prepared any way without skins, refined macaroni, spaghetti, noodles, refined rice.  Avoid:  Bread, rolls, or crackers made with  whole wheat, multi-grains, rye, bran seeds, nuts, or coconut. Corn tortillas or taco shells. Cereals containing whole grains, multi-grains, bran, coconut, nuts, raisins. Cooked or dry oatmeal. Coarse wheat cereals, granola. Cereals advertised as "high-fiber." Potato skins. Whole grain pasta, wild or brown rice. Popcorn. Sweet potatoes, yams. Sweet rolls, doughnuts, waffles, pancakes, sweet breads. Vegetables  Recommended: Strained tomato and vegetable juices. Most well-cooked and canned vegetables without seeds. Fresh: Tender lettuce, cucumber without the skin, cabbage, spinach, bean sprouts.  Avoid: Fresh, cooked, or canned: Artichokes, baked beans, beet greens, broccoli, Brussels sprouts, corn, kale, legumes, peas, sweet potatoes. Cooked: Green or red cabbage, spinach. Avoid large servings of any vegetables because vegetables shrink when cooked, and they contain more fiber per serving than fresh vegetables. Fruit  Recommended: Cooked or canned: Apricots, applesauce, cantaloupe, cherries, fruit cocktail, grapefruit, grapes, kiwi, mandarin oranges, peaches, pears, plums, watermelon. Fresh: Apples without skin, ripe banana, grapes, cantaloupe, cherries, grapefruit, peaches, oranges, plums. Keep servings limited to  cup or 1 piece.  Avoid: Fresh: Apples with skin, apricots, mangoes, pears, raspberries, strawberries. Prune juice, stewed or dried prunes. Dried fruits, raisins, dates. Large servings of all fresh fruits. Protein  Recommended: Ground or well-cooked tender beef, ham, veal, lamb, pork, or poultry. Eggs. Fish, oysters, shrimp, lobster, other seafoods. Liver, organ meats.  Avoid: Tough, fibrous meats with gristle. Peanut butter, smooth or chunky. Cheese, nuts, seeds, legumes, dried peas, beans, lentils. Dairy  Recommended: Yogurt, lactose-free milk, kefir, drinkable yogurt, buttermilk, soy milk, or plain hard cheese.  Avoid: Milk, chocolate milk, beverages made with milk, such as  milkshakes. Soups  Recommended: Bouillon, broth, or soups made from allowed foods. Any strained soup.  Avoid: Soups made from vegetables that are not allowed, cream or milk-based soups. Desserts and Sweets  Recommended: Sugar-free gelatin, sugar-free frozen ice pops made without sugar alcohol.  Avoid: Plain cakes and cookies, pie made with fruit, pudding, custard, cream pie. Gelatin, fruit, ice, sherbet, frozen ice pops. Ice cream, ice milk without nuts. Plain hard candy, honey, jelly, molasses, syrup, sugar, chocolate syrup, gumdrops, marshmallows. Fats and Oils  Recommended: Limit fats to less than 8 tsp per day.  Avoid: Seeds, nuts, olives, avocados. Margarine, butter, cream, mayonnaise, salad oils, plain salad dressings. Plain gravy, crisp bacon without rind. Beverages  Recommended: Water, decaffeinated teas, oral rehydration solutions, sugar-free beverages not sweetened with sugar alcohols.  Avoid: Fruit juices, caffeinated beverages (coffee, tea, soda), alcohol, sports drinks, or lemon-lime soda. Condiments  Recommended: Ketchup, mustard, horseradish, vinegar, cocoa powder. Spices in moderation: allspice, basil, bay leaves, celery powder or leaves, cinnamon, cumin powder, curry powder, ginger, mace, marjoram, onion or garlic powder, oregano, paprika, parsley flakes, ground pepper, rosemary, sage, savory, tarragon, thyme, turmeric.  Avoid: Coconut, honey. Document Released: 01/05/2004 Document Revised: 07/09/2012 Document Reviewed: 02/29/2012 [facility] Patient Information 2014 [place], [place]. Norovirus Infection  Norovirus illness is caused by a viral infection. The term norovirus refers to a group of viruses. Any of those viruses can cause norovirus illness. This illness is often referred to by other names such as viral gastroenteritis, stomach flu, and food poisoning. Anyone can get a norovirus infection. People can have the illness multiple times during their lifetime. CAUSES   Norovirus is found in the stool or vomit of infected people. It is easily spread from person to person (contagious). People with norovirus are contagious from the moment they begin feeling ill. They may remain contagious for as long as 3 days to 2 weeks after recovery. People can become infected with the virus in several ways. This includes:  Eating food or drinking liquids that are contaminated with norovirus.  Touching surfaces or objects contaminated with norovirus, and then placing your hand in your mouth.  Having direct contact with a person who is infected and shows symptoms. This may occur while caring for someone with illness or while sharing foods or eating utensils with someone who is ill. SYMPTOMS  Symptoms usually begin 1 to 2 days after ingestion of the virus. Symptoms may include:  Nausea.  Vomiting.  Diarrhea.  Stomach cramps.  Low-grade fever.  Chills.  Headache.  Muscle aches.  Tiredness. Most people with norovirus illness get better within 1 to 2 days. Some people become dehydrated because they cannot drink enough liquids to replace those lost from vomiting and diarrhea. This is especially true for young children, the elderly, and others who are unable to care for themselves. DIAGNOSIS  Diagnosis is based on your symptoms and exam. Currently, only state public health laboratories have the ability to test for norovirus in stool or vomit. TREATMENT  No specific treatment exists for norovirus infections. No vaccine is available to prevent infections. Norovirus illness is usually brief in healthy people. If you are ill with vomiting and diarrhea, you should drink enough water and fluids to keep your urine clear or pale yellow. Dehydration is the most serious health effect that can result from this infection. By drinking oral rehydration solution (ORS), people can reduce their chance of becoming dehydrated. There are many commercially available pre-made and powdered  ORS designed to safely rehydrate people. These may be recommended by your caregiver. Replace any new fluid losses from diarrhea or vomiting with ORS as follows:  If your child weighs 10 kg or less (22 lb or less), give 60 to 120 ml ( to  cup or 2 to 4 oz) of ORS for each diarrheal stool or vomiting episode.  If your child weighs more than 10 kg (more than 22 lb), give 120 to 240 ml ( to 1 cup or 4 to 8 oz) of ORS for each diarrheal stool or vomiting episode. HOME CARE INSTRUCTIONS   Follow all your caregiver's instructions.  Avoid sugar-free and alcoholic drinks while ill.  Only take over-the-counter or prescription medicines for pain, vomiting, diarrhea, or fever as directed by your caregiver. You can decrease your chances of coming in contact with norovirus or spreading it by following these steps:  Frequently wash your hands, especially after using the toilet, changing diapers, and before eating or preparing food.  Carefully wash fruits and vegetables. Cook shellfish before eating them.  Do not prepare food for others while you are infected and for at least 3 days after recovering from illness.  Thoroughly clean and disinfect contaminated surfaces immediately after an episode of illness using a bleach-based household cleaner.  Immediately  remove and wash clothing or linens that may be contaminated with the virus.  Use the toilet to dispose of any vomit or stool. Make sure the surrounding area is kept clean.  Food that may have been contaminated by an ill person should be discarded. SEEK IMMEDIATE MEDICAL CARE IF:   You develop symptoms of dehydration that do not improve with fluid replacement. This may include:  Excessive sleepiness.  Lack of tears.  Dry mouth.  Dizziness when standing.  Weak pulse. Document Released: 01/05/2003 Document Revised: 01/07/2012 Document Reviewed: 02/06/2010 [facility] Patient Information 2014 [place], [place].

## 2013-12-21 NOTE — Progress Notes (Signed)
   Subjective:    Patient ID: Gordon GuthrieStephen T Stark, male    DOB: [birth year], 48 y.o.   MRN: [id number]  HPI    Review of Systems     Objective:   Physical Exam        Assessment & Plan:

## 2013-12-22 LAB — BASIC METABOLIC PANEL
BUN: 9 mg/dL (ref 6–23)
CALCIUM: 8.9 mg/dL (ref 8.4–10.5)
CO2: 31 mEq/L (ref 19–32)
CREATININE: 1.1 mg/dL (ref 0.50–1.35)
Chloride: 97 mEq/L (ref 96–112)
Glucose, Bld: 107 mg/dL — ABNORMAL HIGH (ref 70–99)
Potassium: 3.4 mEq/L — ABNORMAL LOW (ref 3.5–5.3)
Sodium: 136 mEq/L (ref 135–145)

## 2014-01-19 ENCOUNTER — Other Ambulatory Visit: Payer: Self-pay | Admitting: Internal Medicine

## 2014-02-16 IMAGING — CR DG ABDOMEN ACUTE W/ 1V CHEST
3 series · 3 of 3 positions shown · non-contrast
Comparison: 06/05/2012.

CLINICAL DATA: Abdominal pain, nausea vomiting.

ACUTE ABDOMEN SERIES (ABDOMEN 2 VIEW & CHEST 1 VIEW)

[PA]
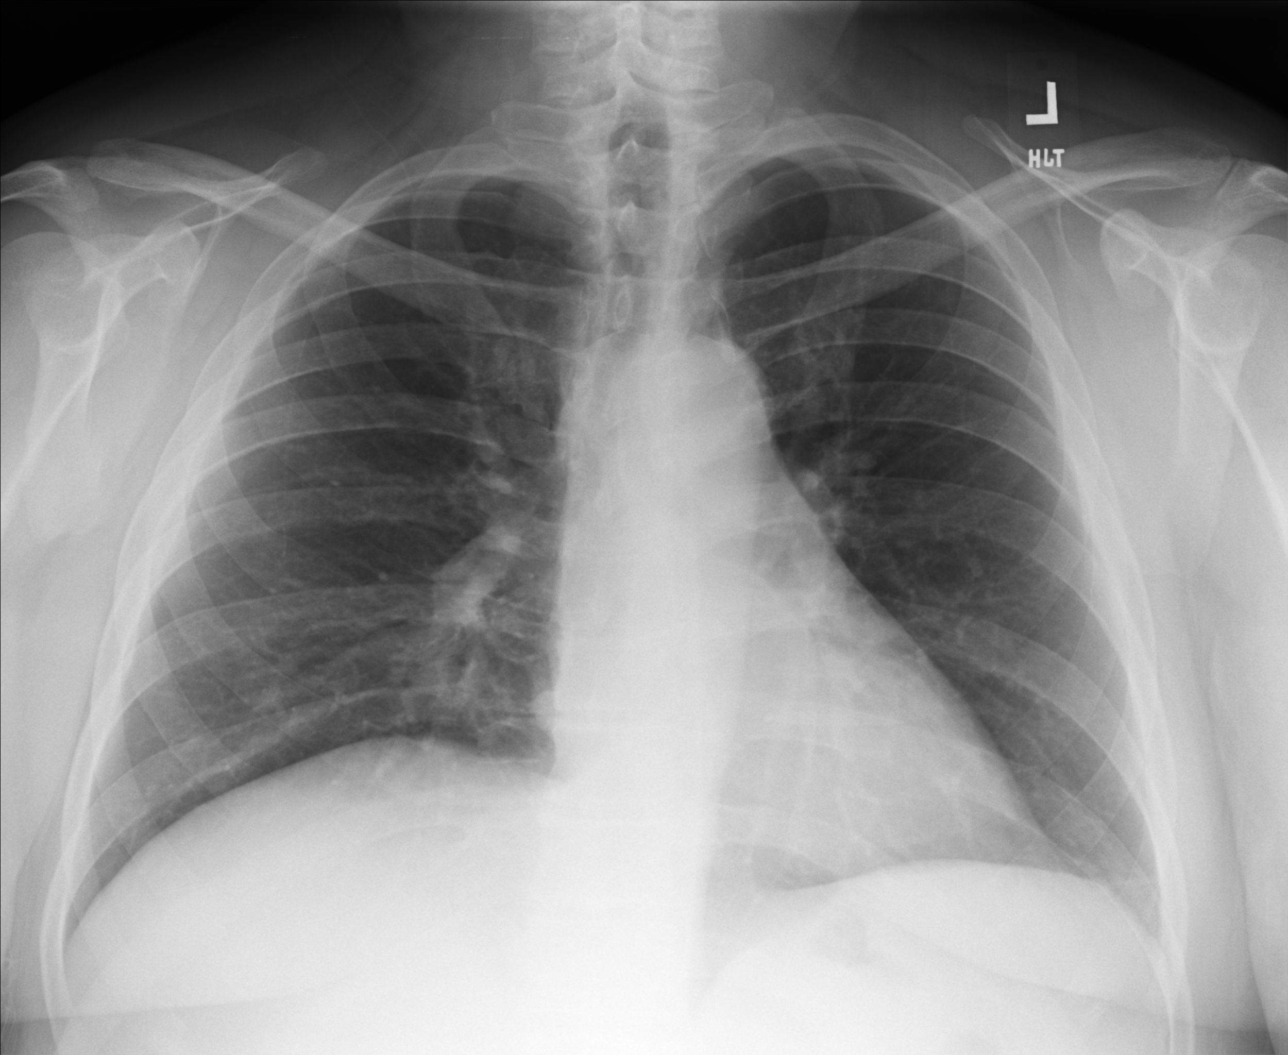

[AP (1 of 2)]
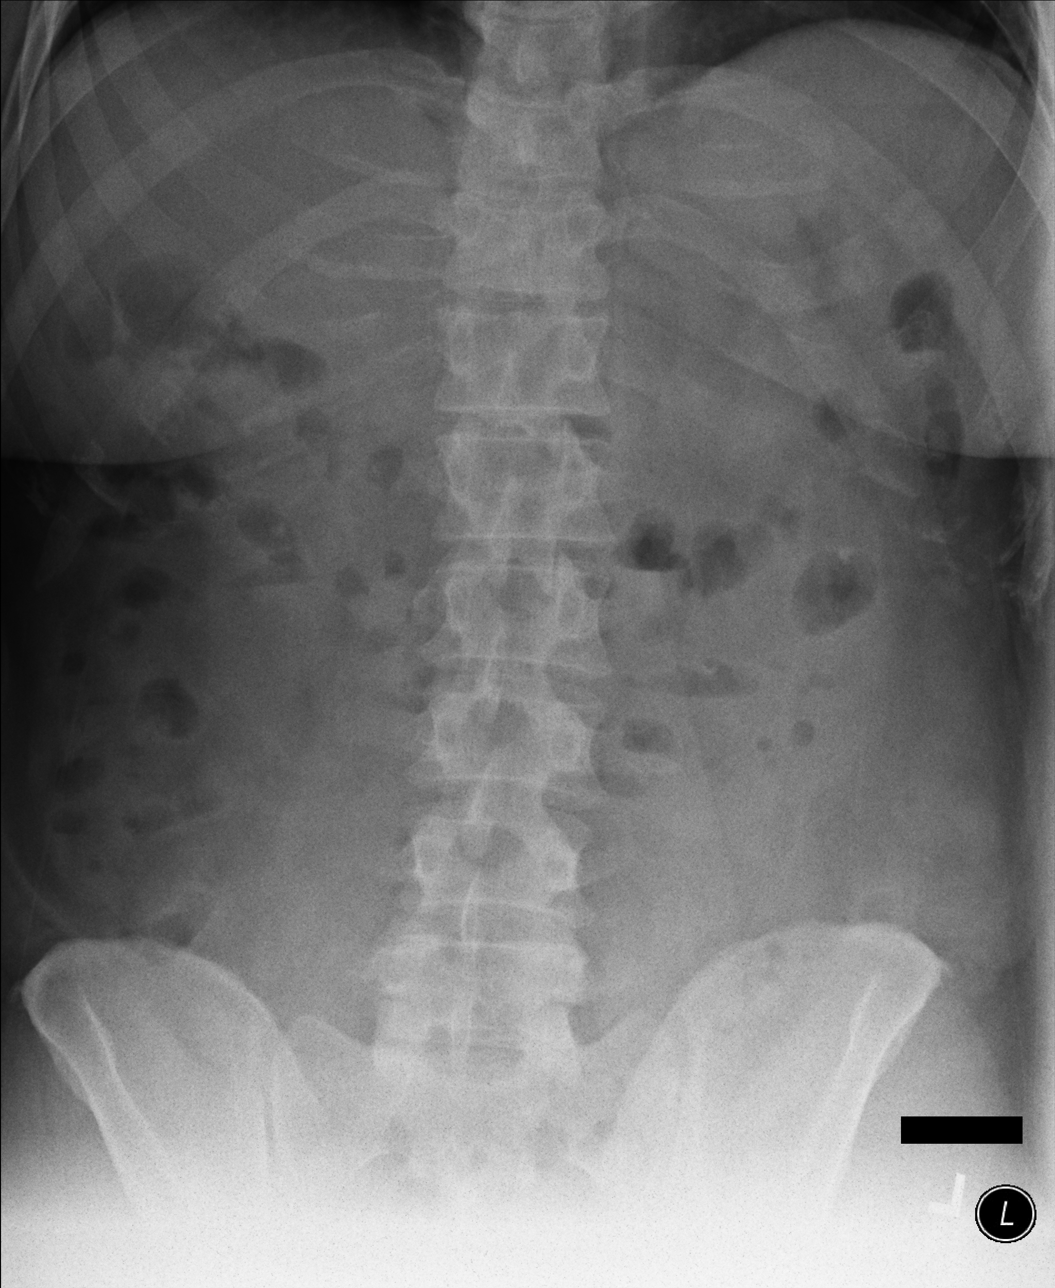

[AP (2 of 2)]
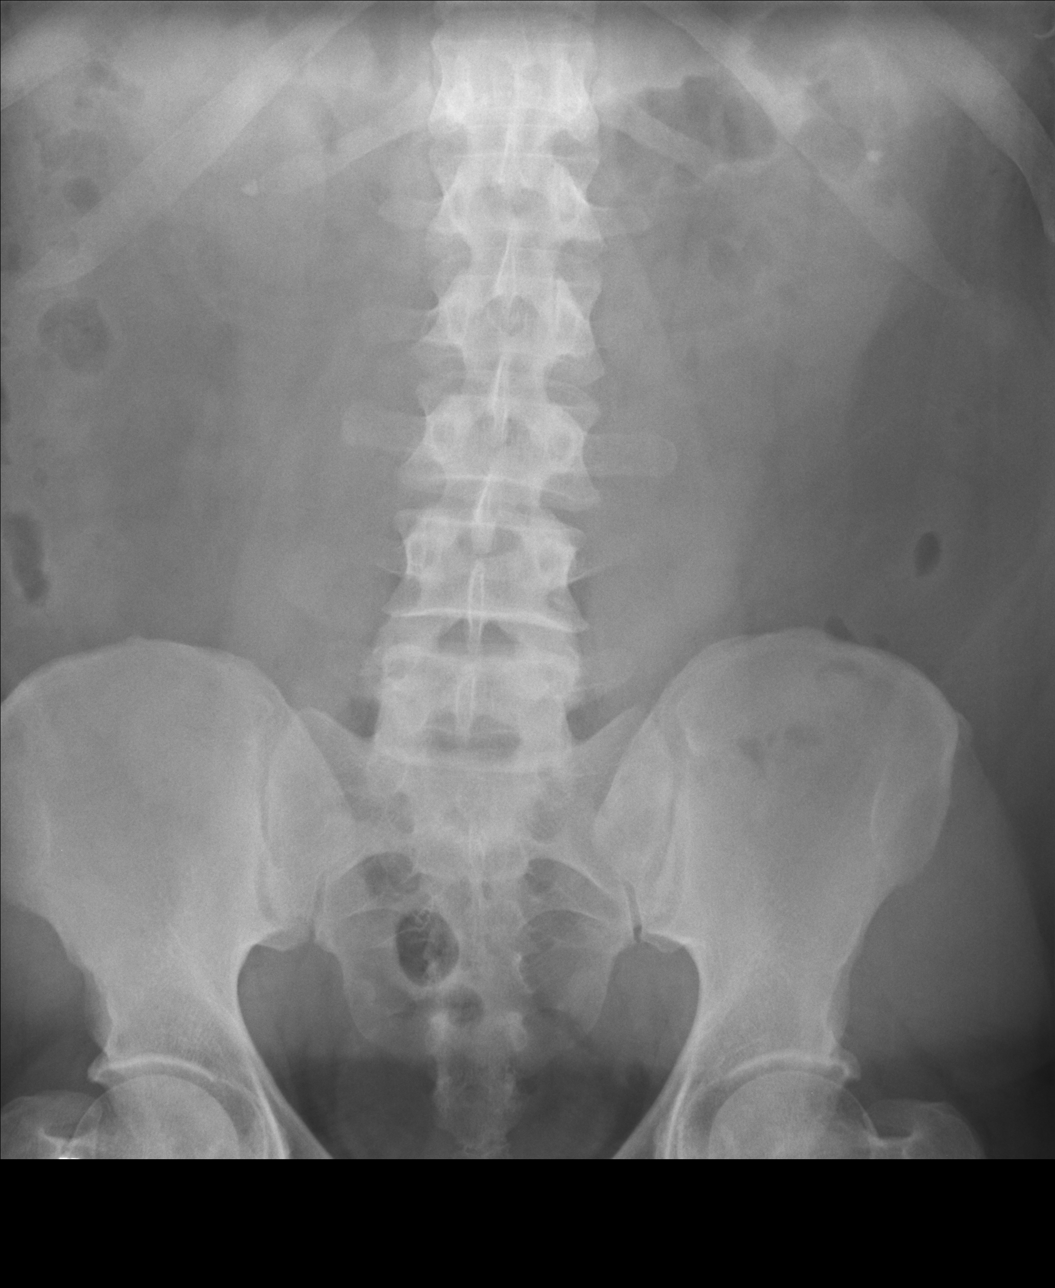

[3 of 3 positions shown; findings below may reference images not displayed]

FINDINGS: The upright chest x-ray demonstrates normal heart size.
The mediastinal and hilar contours are normal.  The lungs are
clear.  No pleural effusion.

Normal bowel gas pattern.  The soft tissue shadows of the abdomen
are maintained.  There are bilateral renal calculi.  The bony
structures are intact.
IMPRESSION: 1.  No acute cardiopulmonary findings.
2.  Bilateral renal calculi.
3.  Normal bowel gas pattern.

## 2014-05-23 ENCOUNTER — Other Ambulatory Visit: Payer: Self-pay | Admitting: Internal Medicine

## 2014-07-16 ENCOUNTER — Telehealth: Payer: Self-pay

## 2014-07-16 ENCOUNTER — Ambulatory Visit (INDEPENDENT_AMBULATORY_CARE_PROVIDER_SITE_OTHER): Payer: PRIVATE HEALTH INSURANCE | Admitting: Family Medicine

## 2014-07-16 VITALS — BP 122/88 | HR 87 | Temp 99.1°F | Resp 18 | Ht 70.5 in | Wt 267.8 lb

## 2014-07-16 DIAGNOSIS — R112 Nausea with vomiting, unspecified: Secondary | ICD-10-CM

## 2014-07-16 DIAGNOSIS — R51 Headache: Secondary | ICD-10-CM

## 2014-07-16 MED ORDER — BUTALBITAL-APAP-CAFFEINE 50-325-40 MG PO TABS: 1.0000 | ORAL_TABLET | Freq: Four times a day (QID) | ORAL | Status: DC | PRN

## 2014-07-16 MED ORDER — PROMETHAZINE HCL 12.5 MG PO TABS: 12.5000 mg | ORAL_TABLET | Freq: Three times a day (TID) | ORAL | Status: DC | PRN

## 2014-07-16 NOTE — Progress Notes (Signed)
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 He lives with his wife and 2 boys who recently had an episode of diarrhea. He developed nausea vomiting and diarrhea late Wednesday night which subsided somewhat yesterday but continued his diarrhea over the last 24 hours with loss of appetite. He's been able to keep some fluids down. He feels weak but not dizzy.  He's had some chills but no definite fever. He's had no abdominal pain.  1 his main complaints now as his headache. It's in the occipital region and does not affect his movement of vision.  Objective: No acute distress, alert and cooperative HEENT: Normal including fundi Neck: Supple no adenopathy Chest: Clear Heart: Regular Abdomen: Soft nontender without HSM, normal bowel sounds, no guarding or rebound  Assessment: Acute gastroenteritis, most likely viral  Plan:Non-intractable vomiting with nausea, vomiting of unspecified type - Plan: promethazine (PHENERGAN) 12.5 MG tablet  Headache(784.0) - Plan: butalbital-acetaminophen-caffeine (FIORICET) 50-325-40 MG per tablet  Signed, Elvina Sidle, MD

## 2014-07-16 NOTE — Telephone Encounter (Signed)
Per physician, clld into [facility] [place] [facility] [phone].Marland KitchenMarland KitchenFioricet 50-325*-40 mg 1-2 tablets by mouth every 6 (six) hours as needed for headache.Marland Kitchen#[id number] w/no refills.

## 2014-07-16 NOTE — Patient Instructions (Signed)

## 2014-08-19 ENCOUNTER — Telehealth: Payer: Self-pay

## 2014-08-19 ENCOUNTER — Ambulatory Visit (INDEPENDENT_AMBULATORY_CARE_PROVIDER_SITE_OTHER): Payer: PRIVATE HEALTH INSURANCE | Admitting: Family Medicine

## 2014-08-19 VITALS — BP 120/90 | HR 67 | Temp 98.2°F | Resp 16 | Ht 70.5 in | Wt 269.8 lb

## 2014-08-19 DIAGNOSIS — M129 Arthropathy, unspecified: Secondary | ICD-10-CM

## 2014-08-19 DIAGNOSIS — M1712 Unilateral primary osteoarthritis, left knee: Secondary | ICD-10-CM

## 2014-08-19 DIAGNOSIS — Z7189 Other specified counseling: Secondary | ICD-10-CM

## 2014-08-19 DIAGNOSIS — I10 Essential (primary) hypertension: Secondary | ICD-10-CM

## 2014-08-19 MED ORDER — KNEE BRACE ADJUSTABLE HINGED MISC: 1.0000 | Freq: Every morning | Status: DC

## 2014-08-19 MED ORDER — PREDNISONE 20 MG PO TABS: 40.0000 mg | ORAL_TABLET | Freq: Every day | ORAL | Status: DC

## 2014-08-19 MED ORDER — CHLORTHALIDONE 25 MG PO TABS: 25.0000 mg | ORAL_TABLET | Freq: Every day | ORAL | Status: DC

## 2014-08-19 NOTE — Patient Instructions (Signed)
Arthritis, Nonspecific °Arthritis is inflammation of a joint. This usually means pain, redness, warmth or swelling are present. One or more joints may be involved. There are a number of types of arthritis. Your caregiver may not be able to tell what type of arthritis you have right away. °CAUSES  °The most common cause of arthritis is the wear and tear on the joint (osteoarthritis). This causes damage to the cartilage, which can break down over time. The knees, hips, back and neck are most often affected by this type of arthritis. °Other types of arthritis and common causes of joint pain include: °· Sprains and other injuries near the joint. Sometimes minor sprains and injuries cause pain and swelling that develop hours later. °· Rheumatoid arthritis. This affects hands, feet and knees. It usually affects both sides of your body at the same time. It is often associated with chronic ailments, fever, weight loss and general weakness. °· Crystal arthritis. Gout and pseudo gout can cause occasional acute severe pain, redness and swelling in the foot, ankle, or knee. °· Infectious arthritis. Bacteria can get into a joint through a break in overlying skin. This can cause infection of the joint. Bacteria and viruses can also spread through the blood and affect your joints. °· Drug, infectious and allergy reactions. Sometimes joints can become mildly painful and slightly swollen with these types of illnesses. °SYMPTOMS  °· Pain is the main symptom. °· Your joint or joints can also be red, swollen and warm or hot to the touch. °· You may have a fever with certain types of arthritis, or even feel overall ill. °· The joint with arthritis will hurt with movement. Stiffness is present with some types of arthritis. °DIAGNOSIS  °Your caregiver will suspect arthritis based on your description of your symptoms and on your exam. Testing may be needed to find the type of arthritis: °· Blood and sometimes urine tests. °· X-ray tests  and sometimes CT or MRI scans. °· Removal of fluid from the joint (arthrocentesis) is done to check for bacteria, crystals or other causes. Your caregiver (or a specialist) will numb the area over the joint with a local anesthetic, and use a needle to remove joint fluid for examination. This procedure is only minimally uncomfortable. °· Even with these tests, your caregiver may not be able to tell what kind of arthritis you have. Consultation with a specialist (rheumatologist) may be helpful. °TREATMENT  °Your caregiver will discuss with you treatment specific to your type of arthritis. If the specific type cannot be determined, then the following general recommendations may apply. °Treatment of severe joint pain includes: °· Rest. °· Elevation. °· Anti-inflammatory medication (for example, ibuprofen) may be prescribed. Avoiding activities that cause increased pain. °· Only take over-the-counter or prescription medicines for pain and discomfort as recommended by your caregiver. °· Cold packs over an inflamed joint may be used for 10 to 15 minutes every hour. Hot packs sometimes feel better, but do not use overnight. Do not use hot packs if you are diabetic without your caregiver's permission. °· A cortisone shot into arthritic joints may help reduce pain and swelling. °· Any acute arthritis that gets worse over the next 1 to 2 days needs to be looked at to be sure there is no joint infection. °Long-term arthritis treatment involves modifying activities and lifestyle to reduce joint stress jarring. This can include weight loss. Also, exercise is needed to nourish the joint cartilage and remove waste. This helps keep the muscles   around the joint strong. °HOME CARE INSTRUCTIONS  °· Do not take aspirin to relieve pain if gout is suspected. This elevates uric acid levels. °· Only take over-the-counter or prescription medicines for pain, discomfort or fever as directed by your caregiver. °· Rest the joint as much as  possible. °· If your joint is swollen, keep it elevated. °· Use crutches if the painful joint is in your leg. °· Drinking plenty of fluids may help for certain types of arthritis. °· Follow your caregiver's dietary instructions. °· Try low-impact exercise such as: °¨ Swimming. °¨ Water aerobics. °¨ Biking. °¨ Walking. °· Morning stiffness is often relieved by a warm shower. °· Put your joints through regular range-of-motion. °SEEK MEDICAL CARE IF:  °· You do not feel better in 24 hours or are getting worse. °· You have side effects to medications, or are not getting better with treatment. °SEEK IMMEDIATE MEDICAL CARE IF:  °· You have a fever. °· You develop severe joint pain, swelling or redness. °· Many joints are involved and become painful and swollen. °· There is severe back pain and/or leg weakness. °· You have loss of bowel or bladder control. °Document Released: 11/22/2004 Document Revised: 01/07/2012 Document Reviewed: 12/08/2008 °Exit[employer]® Patient Information ©2015 [employer]. This information is not intended to replace advice given to you by your health care provider. Make sure you discuss any questions you have with your health care provider. ° °

## 2014-08-19 NOTE — Telephone Encounter (Signed)
Prednisone was sent. Called patient. He had written Rx for BP meds, did not realize the other was electronically submitted, he will go pick this up.

## 2014-08-19 NOTE — Progress Notes (Signed)
This chart was scribed for Elvina SidleKurt Rainey Kahrs, MD by Tonye RoyaltyJoshua Chen, ED Scribe. This patient was seen in room 1 and the patient's care was started at 9:09 AM.    Patient ID: Gordon Stark MRN: [id number], DOB: January 16, 1966, 48 y.o. Date of Encounter: 08/19/2014, 9:08 AM  Primary Physician: Abbe AmsterdamOPLAND,JESSICA, MD  Chief Complaint: left knee pain  HPI: 48 y.o. year old male with history below presents with sharp left knee pain. He reports stepping in a hole a few years ago and twisting it; he states he was told that he bruised the bones. He state he went to [facility] and for arthrocentesis and a cortisone shot. He reports recurrent flare ups of knee pain approximately once a month that resolve in 2-3 days with rest and knee brace. He reports recent activity in the past few days cooking for pig pickings for 2 separate events and a knee pain flare up last night. He also states his hinged brace broke last night. He also reports adverse affects of medication for headache prescribed on 9/18; he states he cannot take caffeine.  He states he works at the [employer] and is sometimes able to work when he has knee pain flare ups, but occasionally it is too severe for him to stand all day at work, like today's issue.   Past Medical History  Diagnosis Date  . Hypertension   . Dysrhythmia   . Allergy      Home Meds: Prior to Admission medications   Medication Sig Start Date End Date Taking? Authorizing Provider  chlorthalidone (HYGROTON) 25 MG tablet Take 1 tablet (25 mg total) by mouth daily. PATIENT NEEDS OFFICE VISIT FOR ADDITIONAL REFILLS 12/21/13  Yes Jonita Albeehris W Guest, MD  diphenhydrAMINE (BENADRYL) 25 MG tablet Take 25 mg by mouth 2 (two) times daily.   Yes Historical Provider, MD  doxycycline (VIBRA-TABS) 100 MG tablet Take 100 mg by mouth 2 (two) times daily.   Yes Historical Provider, MD  famotidine (PEPCID) 20 MG tablet Take 20 mg by mouth 2 (two) times daily.   Yes Historical Provider, MD    butalbital-acetaminophen-caffeine (FIORICET) 50-325-40 MG per tablet Take 1-2 tablets by mouth every 6 (six) hours as needed for headache. 07/16/14 07/16/15  Elvina SidleKurt British Moyd, MD  promethazine (PHENERGAN) 12.5 MG tablet Take 1 tablet (12.5 mg total) by mouth every 8 (eight) hours as needed for nausea or vomiting. 07/16/14   Elvina SidleKurt Anees Vanecek, MD    Allergies:  Allergies  Allergen Reactions  . Salicylates Other (See Comments)    Lips swell so bad they can bust    History   Social History  . Marital Status: Married    Spouse Name: N/A    Number of Children: N/A  . Years of Education: N/A   Occupational History  . Not on file.   Social History Main Topics  . Smoking status: Former Games developermoker  . Smokeless tobacco: Current User    Types: Chew  . Alcohol Use: Yes     Comment: 12 pack beer a week  . Drug Use: No  . Sexual Activity: Not on file   Other Topics Concern  . Not on file   Social History Narrative  . No narrative on file     Review of Systems: Constitutional: negative for chills, fever, night sweats, weight changes, or fatigue  HEENT: negative for vision changes, hearing loss, congestion, rhinorrhea, ST, epistaxis, or sinus pressure Cardiovascular: negative for chest pain or palpitations Respiratory: negative for hemoptysis, wheezing, shortness  of breath, or cough Abdominal: negative for abdominal pain, nausea, vomiting, diarrhea, or constipation Dermatological: negative for rash Neurologic: negative for headache, dizziness, or syncope Musculoskeletal: left knee pain All other systems reviewed and are otherwise negative with the exception to those above and in the HPI.   Physical Exam: Blood pressure 120/90, pulse 67, temperature 98.2 F (36.8 C), temperature source Oral, resp. rate 16, height 5' 10.5" (1.791 m), weight 269 lb 12.8 oz (122.38 kg), SpO2 100.00%., Body mass index is 38.15 kg/(m^2). General: Well developed, well nourished, in no acute distress. Head:  Normocephalic, atraumatic, eyes without discharge, sclera non-icteric, nares are without discharge. Bilateral auditory canals clear, TM's are without perforation, pearly grey and translucent with reflective cone of light bilaterally. Oral cavity moist, posterior pharynx without exudate, erythema, peritonsillar abscess, or post nasal drip.  Neck: Supple. No thyromegaly. Full ROM. No lymphadenopathy. Lungs: Clear bilaterally to auscultation without wheezes, rales, or rhonchi. Breathing is unlabored. Heart: RRR with S1 S2. No murmurs, rubs, or gallops appreciated. Abdomen: Soft, non-tender, non-distended with normoactive bowel sounds. No hepatomegaly. No rebound/guarding. No obvious abdominal masses. Msk:  Strength and tone normal for age. Extremities/Skin: Warm and dry. No clubbing or cyanosis. No edema. No rashes or suspicious lesions.  Mild swelling of left knee Neuro: Alert and oriented X 3. Moves all extremities spontaneously. Gait is antalgic CNII-XII grossly in tact. Psych:  Responds to questions appropriately with a normal affect.    ASSESSMENT AND PLAN:  48 y.o. year old male with Arthritis of knee, left  Encounter for medication review and counseling - Plan: chlorthalidone (HYGROTON) 25 MG tablet  Essential hypertension  Hinged knee brace provided. Prescription for prednisone called in. Patient to return this weekend if pain not controlled or he feels he can return to work on Monday    Signed, Elvina SidleKurt Jozlin Bently, MD 08/19/2014 9:08 AM

## 2014-08-19 NOTE — Telephone Encounter (Signed)
Patient states Dr. Elbert EwingsL refilled his BP medication and not the cortisone pill today. Please return call and advise. CB # [phone]

## 2014-08-31 ENCOUNTER — Telehealth: Payer: Self-pay | Admitting: Family Medicine

## 2014-08-31 ENCOUNTER — Encounter: Payer: Self-pay | Admitting: Family Medicine

## 2014-08-31 DIAGNOSIS — M1712 Unilateral primary osteoarthritis, left knee: Secondary | ICD-10-CM

## 2014-08-31 NOTE — Telephone Encounter (Signed)
FMLA dropped off on 08-28-2014. Placed in Dr. Loma BostonLauenstein's box on 08-31-2014. Please return when finished.

## 2014-09-01 ENCOUNTER — Encounter: Payer: Self-pay | Admitting: Physician Assistant

## 2014-09-01 DIAGNOSIS — M1712 Unilateral primary osteoarthritis, left knee: Secondary | ICD-10-CM | POA: Insufficient documentation

## 2014-09-01 NOTE — Telephone Encounter (Signed)
Filled out paperwork with information from last OV with Dr. Milus GlazierLauenstein.  Called pt- he was out of work 10/22-10/25 he returned to work 10/26. He typically has 1 flare up a month and is absent from work 2-3 days at most to allow for rest and ice of his knee. He uses his hinged knee brace regularly.  Pt needs FMLA paperwork to state this.   Paperwork has been put in [employer]'s box- discussed briefly with her.

## 2014-09-01 NOTE — Telephone Encounter (Signed)
Form completed and signed. Returned to Pepsi[employer]Sara Hansen, LPN. Problem list updated.

## 2014-09-01 NOTE — Telephone Encounter (Signed)
Patient called and states that his FMLA is due tomorrow. Dr. Milus GlazierLauenstein is out of office. Spoke to Dellia NimsSarah Hansen LPN. She and another provider will go through Dr. Cain SaupeL's notes and complete paperwork for patient. Jasmine or Lauren will call patient when ppw is finished.   Thank You!!!!

## 2014-09-01 NOTE — Telephone Encounter (Signed)
Notified patient by VM that his FMLA paperwork is ready for pick up at 102

## 2014-09-02 DIAGNOSIS — Z0271 Encounter for disability determination: Secondary | ICD-10-CM

## 2014-11-02 ENCOUNTER — Ambulatory Visit (INDEPENDENT_AMBULATORY_CARE_PROVIDER_SITE_OTHER): Payer: PRIVATE HEALTH INSURANCE | Admitting: Family Medicine

## 2014-11-02 VITALS — BP 128/84 | HR 68 | Temp 98.5°F | Resp 18 | Ht 70.5 in | Wt 274.0 lb

## 2014-11-02 DIAGNOSIS — M62830 Muscle spasm of back: Secondary | ICD-10-CM

## 2014-11-02 DIAGNOSIS — S39012A Strain of muscle, fascia and tendon of lower back, initial encounter: Secondary | ICD-10-CM

## 2014-11-02 MED ORDER — CYCLOBENZAPRINE HCL 5 MG PO TABS: ORAL_TABLET | ORAL | Status: DC

## 2014-11-02 MED ORDER — HYDROCODONE-ACETAMINOPHEN 5-325 MG PO TABS: 1.0000 | ORAL_TABLET | Freq: Four times a day (QID) | ORAL | Status: DC | PRN

## 2014-11-02 NOTE — Patient Instructions (Signed)
Heat or ice to affected area as needed. Gentle range of motion, flexeril up to every 8 hours as needed for muscle spasm, and only if needed for more severe pain - you can take the hydrocodone.  Be careful combining the hydrocodone and flexeril as both cause sedation. If not continuing to improve over the next week to 10 days - return for repeat exam and xray. Return to the clinic or go to the nearest emergency room if any of your symptoms worsen or new symptoms occur.  See the back care manual - other exercises listed below as tolerated.   Low Back Sprain with Rehab  A sprain is an injury in which a ligament is torn. The ligaments of the lower back are vulnerable to sprains. However, they are strong and require great force to be injured. These ligaments are important for stabilizing the spinal column. Sprains are classified into three categories. Grade 1 sprains cause pain, but the tendon is not lengthened. Grade 2 sprains include a lengthened ligament, due to the ligament being stretched or partially ruptured. With grade 2 sprains there is still function, although the function may be decreased. Grade 3 sprains involve a complete tear of the tendon or muscle, and function is usually impaired. SYMPTOMS   Severe pain in the lower back.  Sometimes, a feeling of a "pop," "snap," or tear, at the time of injury.  Tenderness and sometimes swelling at the injury site.  Uncommonly, bruising (contusion) within 48 hours of injury.  Muscle spasms in the back. CAUSES  Low back sprains occur when a force is placed on the ligaments that is greater than they can handle. Common causes of injury include:  Performing a stressful act while off-balance.  Repetitive stressful activities that involve movement of the lower back.  Direct hit (trauma) to the lower back. RISK INCREASES WITH:  Contact sports (football, wrestling).  Collisions (major skiing accidents).  Sports that require throwing or lifting  (baseball, weightlifting).  Sports involving twisting of the spine (gymnastics, diving, tennis, golf).  Poor strength and flexibility.  Inadequate protection.  Previous back injury or surgery (especially fusion). PREVENTION  Wear properly fitted and padded protective equipment.  Warm up and stretch properly before activity.  Allow for adequate recovery between workouts.  Maintain physical fitness:  Strength, flexibility, and endurance.  Cardiovascular fitness.  Maintain a healthy body weight. PROGNOSIS  If treated properly, low back sprains usually heal with non-surgical treatment. The length of time for healing depends on the severity of the injury.  RELATED COMPLICATIONS   Recurring symptoms, resulting in a chronic problem.  Chronic inflammation and pain in the low back.  Delayed healing or resolution of symptoms, especially if activity is resumed too soon.  Prolonged impairment.  Unstable or arthritic joints of the low back. TREATMENT  Treatment first involves the use of ice and medicine, to reduce pain and inflammation. The use of strengthening and stretching exercises may help reduce pain with activity. These exercises may be performed at home or with a therapist. Severe injuries may require referral to a therapist for further evaluation and treatment, such as ultrasound. Your caregiver may advise that you wear a back brace or corset, to help reduce pain and discomfort. Often, prolonged bed rest results in greater harm then benefit. Corticosteroid injections may be recommended. However, these should be reserved for the most serious cases. It is important to avoid using your back when lifting objects. At night, sleep on your back on a firm mattress,  with a pillow placed under your knees. If non-surgical treatment is unsuccessful, surgery may be needed.  MEDICATION   If pain medicine is needed, nonsteroidal anti-inflammatory medicines (aspirin and ibuprofen), or other  minor pain relievers (acetaminophen), are often advised.  Do not take pain medicine for 7 days before surgery.  Prescription pain relievers may be given, if your caregiver thinks they are needed. Use only as directed and only as much as you need.  Ointments applied to the skin may be helpful.  Corticosteroid injections may be given by your caregiver. These injections should be reserved for the most serious cases, because they may only be given a certain number of times. HEAT AND COLD  Cold treatment (icing) should be applied for 10 to 15 minutes every 2 to 3 hours for inflammation and pain, and immediately after activity that aggravates your symptoms. Use ice packs or an ice massage.  Heat treatment may be used before performing stretching and strengthening activities prescribed by your caregiver, physical therapist, or athletic trainer. Use a heat pack or a warm water soak. SEEK MEDICAL CARE IF:   Symptoms get worse or do not improve in 2 to 4 weeks, despite treatment.  You develop numbness or weakness in either leg.  You lose bowel or bladder function.  Any of the following occur after surgery: fever, increased pain, swelling, redness, drainage of fluids, or bleeding in the affected area.  New, unexplained symptoms develop. (Drugs used in treatment may produce side effects.) EXERCISES  RANGE OF MOTION (ROM) AND STRETCHING EXERCISES - Low Back Sprain Most people with lower back pain will find that their symptoms get worse with excessive bending forward (flexion) or arching at the lower back (extension). The exercises that will help resolve your symptoms will focus on the opposite motion.  Your physician, physical therapist or athletic trainer will help you determine which exercises will be most helpful to resolve your lower back pain. Do not complete any exercises without first consulting with your caregiver. Discontinue any exercises which make your symptoms worse, until you speak to  your caregiver. If you have pain, numbness or tingling which travels down into your buttocks, leg or foot, the goal of the therapy is for these symptoms to move closer to your back and eventually resolve. Sometimes, these leg symptoms will get better, but your lower back pain may worsen. This is often an indication of progress in your rehabilitation. Be very alert to any changes in your symptoms and the activities in which you participated in the 24 hours prior to the change. Sharing this information with your caregiver will allow him or her to most efficiently treat your condition. These exercises may help you when beginning to rehabilitate your injury. Your symptoms may resolve with or without further involvement from your physician, physical therapist or athletic trainer. While completing these exercises, remember:   Restoring tissue flexibility helps normal motion to return to the joints. This allows healthier, less painful movement and activity.  An effective stretch should be held for at least 30 seconds.  A stretch should never be painful. You should only feel a gentle lengthening or release in the stretched tissue. FLEXION RANGE OF MOTION AND STRETCHING EXERCISES: STRETCH - Flexion, Single Knee to Chest   Lie on a firm bed or floor with both legs extended in front of you.  Keeping one leg in contact with the floor, bring your opposite knee to your chest. Hold your leg in place by either grabbing behind your  thigh or at your knee.  Pull until you feel a gentle stretch in your low back. Hold __________ seconds.  Slowly release your grasp and repeat the exercise with the opposite side. Repeat __________ times. Complete this exercise __________ times per day.  STRETCH - Flexion, Double Knee to Chest  Lie on a firm bed or floor with both legs extended in front of you.  Keeping one leg in contact with the floor, bring your opposite knee to your chest.  Tense your stomach muscles to  support your back and then lift your other knee to your chest. Hold your legs in place by either grabbing behind your thighs or at your knees.  Pull both knees toward your chest until you feel a gentle stretch in your low back. Hold __________ seconds.  Tense your stomach muscles and slowly return one leg at a time to the floor. Repeat __________ times. Complete this exercise __________ times per day.  STRETCH - Low Trunk Rotation  Lie on a firm bed or floor. Keeping your legs in front of you, bend your knees so they are both pointed toward the ceiling and your feet are flat on the floor.  Extend your arms out to the side. This will stabilize your upper body by keeping your shoulders in contact with the floor.  Gently and slowly drop both knees together to one side until you feel a gentle stretch in your low back. Hold for __________ seconds.  Tense your stomach muscles to support your lower back as you bring your knees back to the starting position. Repeat the exercise to the other side. Repeat __________ times. Complete this exercise __________ times per day  EXTENSION RANGE OF MOTION AND FLEXIBILITY EXERCISES: STRETCH - Extension, Prone on Elbows   Lie on your stomach on the floor, a bed will be too soft. Place your palms about shoulder width apart and at the height of your head.  Place your elbows under your shoulders. If this is too painful, stack pillows under your chest.  Allow your body to relax so that your hips drop lower and make contact more completely with the floor.  Hold this position for __________ seconds.  Slowly return to lying flat on the floor. Repeat __________ times. Complete this exercise __________ times per day.  RANGE OF MOTION - Extension, Prone Press Ups  Lie on your stomach on the floor, a bed will be too soft. Place your palms about shoulder width apart and at the height of your head.  Keeping your back as relaxed as possible, slowly straighten your  elbows while keeping your hips on the floor. You may adjust the placement of your hands to maximize your comfort. As you gain motion, your hands will come more underneath your shoulders.  Hold this position __________ seconds.  Slowly return to lying flat on the floor. Repeat __________ times. Complete this exercise __________ times per day.  RANGE OF MOTION- Quadruped, Neutral Spine   Assume a hands and knees position on a firm surface. Keep your hands under your shoulders and your knees under your hips. You may place padding under your knees for comfort.  Drop your head and point your tailbone toward the ground below you. This will round out your lower back like an angry cat. Hold this position for __________ seconds.  Slowly lift your head and release your tail bone so that your back sags into a large arch, like an old horse.  Hold this position for __________ seconds.  Repeat this until you feel limber in your low back.  Now, find your "sweet spot." This will be the most comfortable position somewhere between the two previous positions. This is your neutral spine. Once you have found this position, tense your stomach muscles to support your low back.  Hold this position for __________ seconds. Repeat __________ times. Complete this exercise __________ times per day.  STRENGTHENING EXERCISES - Low Back Sprain These exercises may help you when beginning to rehabilitate your injury. These exercises should be done near your "sweet spot." This is the neutral, low-back arch, somewhere between fully rounded and fully arched, that is your least painful position. When performed in this safe range of motion, these exercises can be used for people who have either a flexion or extension based injury. These exercises may resolve your symptoms with or without further involvement from your physician, physical therapist or athletic trainer. While completing these exercises, remember:   Muscles can gain  both the endurance and the strength needed for everyday activities through controlled exercises.  Complete these exercises as instructed by your physician, physical therapist or athletic trainer. Increase the resistance and repetitions only as guided.  You may experience muscle soreness or fatigue, but the pain or discomfort you are trying to eliminate should never worsen during these exercises. If this pain does worsen, stop and make certain you are following the directions exactly. If the pain is still present after adjustments, discontinue the exercise until you can discuss the trouble with your caregiver. STRENGTHENING - Deep Abdominals, Pelvic Tilt   Lie on a firm bed or floor. Keeping your legs in front of you, bend your knees so they are both pointed toward the ceiling and your feet are flat on the floor.  Tense your lower abdominal muscles to press your low back into the floor. This motion will rotate your pelvis so that your tail bone is scooping upwards rather than pointing at your feet or into the floor. With a gentle tension and even breathing, hold this position for __________ seconds. Repeat __________ times. Complete this exercise __________ times per day.  STRENGTHENING - Abdominals, Crunches   Lie on a firm bed or floor. Keeping your legs in front of you, bend your knees so they are both pointed toward the ceiling and your feet are flat on the floor. Cross your arms over your chest.  Slightly tip your chin down without bending your neck.  Tense your abdominals and slowly lift your trunk high enough to just clear your shoulder blades. Lifting higher can put excessive stress on the lower back and does not further strengthen your abdominal muscles.  Control your return to the starting position. Repeat __________ times. Complete this exercise __________ times per day.  STRENGTHENING - Quadruped, Opposite UE/LE Lift   Assume a hands and knees position on a firm surface. Keep your  hands under your shoulders and your knees under your hips. You may place padding under your knees for comfort.  Find your neutral spine and gently tense your abdominal muscles so that you can maintain this position. Your shoulders and hips should form a rectangle that is parallel with the floor and is not twisted.  Keeping your trunk steady, lift your right hand no higher than your shoulder and then your left leg no higher than your hip. Make sure you are not holding your breath. Hold this position for __________ seconds.  Continuing to keep your abdominal muscles tense and your back steady, slowly return  to your starting position. Repeat with the opposite arm and leg. Repeat __________ times. Complete this exercise __________ times per day.  STRENGTHENING - Abdominals and Quadriceps, Straight Leg Raise   Lie on a firm bed or floor with both legs extended in front of you.  Keeping one leg in contact with the floor, bend the other knee so that your foot can rest flat on the floor.  Find your neutral spine, and tense your abdominal muscles to maintain your spinal position throughout the exercise.  Slowly lift your straight leg off the floor about 6 inches for a count of 15, making sure to not hold your breath.  Still keeping your neutral spine, slowly lower your leg all the way to the floor. Repeat this exercise with each leg __________ times. Complete this exercise __________ times per day. POSTURE AND BODY MECHANICS CONSIDERATIONS - Low Back Sprain Keeping correct posture when sitting, standing or completing your activities will reduce the stress put on different body tissues, allowing injured tissues a chance to heal and limiting painful experiences. The following are general guidelines for improved posture. Your physician or physical therapist will provide you with any instructions specific to your needs. While reading these guidelines, remember:  The exercises prescribed by your provider  will help you have the flexibility and strength to maintain correct postures.  The correct posture provides the best environment for your joints to work. All of your joints have less wear and tear when properly supported by a spine with good posture. This means you will experience a healthier, less painful body.  Correct posture must be practiced with all of your activities, especially prolonged sitting and standing. Correct posture is as important when doing repetitive low-stress activities (typing) as it is when doing a single heavy-load activity (lifting). RESTING POSITIONS Consider which positions are most painful for you when choosing a resting position. If you have pain with flexion-based activities (sitting, bending, stooping, squatting), choose a position that allows you to rest in a less flexed posture. You would want to avoid curling into a fetal position on your side. If your pain worsens with extension-based activities (prolonged standing, working overhead), avoid resting in an extended position such as sleeping on your stomach. Most people will find more comfort when they rest with their spine in a more neutral position, neither too rounded nor too arched. Lying on a non-sagging bed on your side with a pillow between your knees, or on your back with a pillow under your knees will often provide some relief. Keep in mind, being in any one position for a prolonged period of time, no matter how correct your posture, can still lead to stiffness. PROPER SITTING POSTURE In order to minimize stress and discomfort on your spine, you must sit with correct posture. Sitting with good posture should be effortless for a healthy body. Returning to good posture is a gradual process. Many people can work toward this most comfortably by using various supports until they have the flexibility and strength to maintain this posture on their own. When sitting with proper posture, your ears will fall over your  shoulders and your shoulders will fall over your hips. You should use the back of the chair to support your upper back. Your lower back will be in a neutral position, just slightly arched. You may place a small pillow or folded towel at the base of your lower back for  support.  When working at a desk, create an environment that supports  good, upright posture. Without extra support, muscles tire, which leads to excessive strain on joints and other tissues. Keep these recommendations in mind: CHAIR:  A chair should be able to slide under your desk when your back makes contact with the back of the chair. This allows you to work closely.  The chair's height should allow your eyes to be level with the upper part of your monitor and your hands to be slightly lower than your elbows. BODY POSITION  Your feet should make contact with the floor. If this is not possible, use a foot rest.  Keep your ears over your shoulders. This will reduce stress on your neck and low back. INCORRECT SITTING POSTURES  If you are feeling tired and unable to assume a healthy sitting posture, do not slouch or slump. This puts excessive strain on your back tissues, causing more damage and pain. Healthier options include:  Using more support, like a lumbar pillow.  Switching tasks to something that requires you to be upright or walking.  Talking a brief walk.  Lying down to rest in a neutral-spine position. PROLONGED STANDING WHILE SLIGHTLY LEANING FORWARD  When completing a task that requires you to lean forward while standing in one place for a long time, place either foot up on a stationary 2-4 inch high object to help maintain the best posture. When both feet are on the ground, the lower back tends to lose its slight inward curve. If this curve flattens (or becomes too large), then the back and your other joints will experience too much stress, tire more quickly, and can cause pain. CORRECT STANDING POSTURES Proper  standing posture should be assumed with all daily activities, even if they only take a few moments, like when brushing your teeth. As in sitting, your ears should fall over your shoulders and your shoulders should fall over your hips. You should keep a slight tension in your abdominal muscles to brace your spine. Your tailbone should point down to the ground, not behind your body, resulting in an over-extended swayback posture.  INCORRECT STANDING POSTURES  Common incorrect standing postures include a forward head, locked knees and/or an excessive swayback. WALKING Walk with an upright posture. Your ears, shoulders and hips should all line-up. PROLONGED ACTIVITY IN A FLEXED POSITION When completing a task that requires you to bend forward at your waist or lean over a low surface, try to find a way to stabilize 3 out of 4 of your limbs. You can place a hand or elbow on your thigh or rest a knee on the surface you are reaching across. This will provide you more stability, so that your muscles do not tire as quickly. By keeping your knees relaxed, or slightly bent, you will also reduce stress across your lower back. CORRECT LIFTING TECHNIQUES DO :  Assume a wide stance. This will provide you more stability and the opportunity to get as close as possible to the object which you are lifting.  Tense your abdominals to brace your spine. Bend at the knees and hips. Keeping your back locked in a neutral-spine position, lift using your leg muscles. Lift with your legs, keeping your back straight.  Test the weight of unknown objects before attempting to lift them.  Try to keep your elbows locked down at your sides in order get the best strength from your shoulders when carrying an object.  Always ask for help when lifting heavy or awkward objects. INCORRECT LIFTING TECHNIQUES DO NOT:  Lock your knees when lifting, even if it is a small object.  Bend and twist. Pivot at your feet or move your feet when  needing to change directions.  Assume that you can safely pick up even a paperclip without proper posture. Document Released: 10/15/2005 Document Revised: 01/07/2012 Document Reviewed: 01/27/2009 [facility] Patient Information 2015 [place], [place]. This information is not intended to replace advice given to you by your health care provider. Make sure you discuss any questions you have with your health care provider.

## 2014-11-02 NOTE — Progress Notes (Addendum)
Subjective:    Patient ID: Gordon Stark, male    DOB: 09/26/1966, 49 y.o.   MRN: [id number] This chart was scribed for Gordon Staggers, MD by Jolene Provost, Medical Scribe. This patient was seen in Room 2 and the patient's care was started a 9:02 AM.   HPI HPI Comments: Gordon Stark is a 49 y.o. male with a hx of HTN and arthritis of left knee who presents to [facility] complaining of left, lower back pain that radiates down the left side for the last three days ago. Pt states his tightness and pain began after he cut down a tree in his yard three days ago. Pt states that leaning over while sitting reduces his pain. Standing with a straight back makes pain worse. Pt states the pain initially radiated to the left knee, but only radiates to the left buttock today. Pt denies loss of bowel or bladder control, saddle anesthesia, or lower extremity weakness. Pt states he is allergic to salicylate antiinflammatory drugs.  Pt states he has had a back injury in the past, and received an MRI which showed a strain.    Patient Active Problem List   Diagnosis Date Noted  . Arthritis of knee, left 09/01/2014  . HTN (hypertension) 12/07/2012   Past Medical History  Diagnosis Date  . Hypertension   . Dysrhythmia   . Allergy    Past Surgical History  Procedure Laterality Date  . Lithotripsy      36yrs ago  . Cystoscopy      6 times   Allergies  Allergen Reactions  . Salicylates Other (See Comments)    Lips swell so bad they can bust  . Fioricet [Butalbital-Apap-Caffeine] Palpitations   Prior to Admission medications   Medication Sig Start Date End Date Taking? Authorizing Provider  chlorthalidone (HYGROTON) 25 MG tablet Take 1 tablet (25 mg total) by mouth daily. 08/19/14  Yes Elvina Sidle, MD  diphenhydrAMINE (BENADRYL) 25 MG tablet Take 25 mg by mouth 2 (two) times daily.   Yes Historical Provider, MD  famotidine (PEPCID) 20 MG tablet Take 20 mg by mouth 2 (two) times daily.   Yes  Historical Provider, MD  doxycycline (VIBRA-TABS) 100 MG tablet Take 100 mg by mouth 2 (two) times daily.    Historical Provider, MD  Elastic Bandages & Supports (KNEE BRACE ADJUSTABLE HINGED) MISC 1 each by Does not apply route every morning. 08/19/14   Elvina Sidle, MD  predniSONE (DELTASONE) 20 MG tablet Take 2 tablets (40 mg total) by mouth daily. Patient not taking: Reported on 11/02/2014 08/19/14   Elvina Sidle, MD  promethazine (PHENERGAN) 12.5 MG tablet Take 1 tablet (12.5 mg total) by mouth every 8 (eight) hours as needed for nausea or vomiting. Patient not taking: Reported on 11/02/2014 07/16/14   Elvina Sidle, MD   History   Social History  . Marital Status: Married    Spouse Name: N/A    Number of Children: N/A  . Years of Education: N/A   Occupational History  . Not on file.   Social History Main Topics  . Smoking status: Former Games developer  . Smokeless tobacco: Current User    Types: Chew  . Alcohol Use: 0.0 oz/week    0 Not specified per week     Comment: 12 pack beer a week  . Drug Use: No  . Sexual Activity: Not on file   Other Topics Concern  . Not on file   Social History Narrative  Review of Systems  Gastrointestinal:       No loss of bowel control  Genitourinary: Negative for frequency.       No loss of bladder control.   Musculoskeletal: Positive for back pain.  Neurological: Negative for weakness and numbness.       Objective:   Physical Exam  Constitutional: He is oriented to person, place, and time. He appears well-developed and well-nourished.  HENT:  Head: Normocephalic and atraumatic.  Eyes: Pupils are equal, round, and reactive to light.  Neck: Neck supple.  Cardiovascular: Normal rate and regular rhythm.   Pulmonary/Chest: Effort normal and breath sounds normal. No respiratory distress.  Musculoskeletal:  LS spine no midline body tenderness. Minimal tenderness in left lower paraspinal muscles. ROM reduced with right lateral flexion  and extension. Flexion to 90 degrees. Equal rotation. Able to heel and toe walk without difficulty. Negative straight leg raise. Patella and achillis reflexes 2+. Negative Babinski bilaterally.  Neurological: He is alert and oriented to person, place, and time.  Skin: Skin is warm and dry.  Psychiatric: He has a normal mood and affect. His behavior is normal.  Nursing note and vitals reviewed.   Filed Vitals:   11/02/14 0832  BP: 128/84  Pulse: 68  Temp: 98.5 F (36.9 C)  TempSrc: Oral  Resp: 18  Height: 5' 10.5" (1.791 m)  Weight: 274 lb (124.286 kg)  SpO2: 99%       Assessment & Plan:   QUENTEN NAWAZ is a 49 y.o. male Low back strain, initial encounter - Plan: HYDROcodone-acetaminophen (NORCO/VICODIN) 5-325 MG per tablet  Muscle spasm of back - Plan: cyclobenzaprine (FLEXERIL) 5 MG tablet  No acute injury, slow onset of tightness then pain - suspect mm spasm component with sprain. No concerning findings on exam, so XR deferred.  - trial of flexeril  tid prn - SED, and if needed for more severe pain - lortab Q6h prn - additive side effects discussed.   -heat or ice, rom and HEP as tolerated by back care manual and info in AVS below.   -rtc precautions.   -note for work yesterday and today.   Meds ordered this encounter  Medications  . cyclobenzaprine (FLEXERIL) 5 MG tablet    Sig: 1 pill by mouth up to every 8 hours as needed. Start with one pill by mouth each bedtime as needed due to sedation    Dispense:  15 tablet    Refill:  0  . HYDROcodone-acetaminophen (NORCO/VICODIN) 5-325 MG per tablet    Sig: Take 1 tablet by mouth every 6 (six) hours as needed for moderate pain.    Dispense:  20 tablet    Refill:  0   Patient Instructions  Heat or ice to affected area as needed. Gentle range of motion, flexeril up to every 8 hours as needed for muscle spasm, and only if needed for more severe pain - you can take the hydrocodone.  Be careful combining the hydrocodone and  flexeril as both cause sedation. If not continuing to improve over the next week to 10 days - return for repeat exam and xray. Return to the clinic or go to the nearest emergency room if any of your symptoms worsen or new symptoms occur.  See the back care manual - other exercises listed below as tolerated.   Low Back Sprain with Rehab  A sprain is an injury in which a ligament is torn. The ligaments of the lower back are vulnerable to sprains.  However, they are strong and require great force to be injured. These ligaments are important for stabilizing the spinal column. Sprains are classified into three categories. Grade 1 sprains cause pain, but the tendon is not lengthened. Grade 2 sprains include a lengthened ligament, due to the ligament being stretched or partially ruptured. With grade 2 sprains there is still function, although the function may be decreased. Grade 3 sprains involve a complete tear of the tendon or muscle, and function is usually impaired. SYMPTOMS   Severe pain in the lower back.  Sometimes, a feeling of a "pop," "snap," or tear, at the time of injury.  Tenderness and sometimes swelling at the injury site.  Uncommonly, bruising (contusion) within 48 hours of injury.  Muscle spasms in the back. CAUSES  Low back sprains occur when a force is placed on the ligaments that is greater than they can handle. Common causes of injury include:  Performing a stressful act while off-balance.  Repetitive stressful activities that involve movement of the lower back.  Direct hit (trauma) to the lower back. RISK INCREASES WITH:  Contact sports (football, wrestling).  Collisions (major skiing accidents).  Sports that require throwing or lifting (baseball, weightlifting).  Sports involving twisting of the spine (gymnastics, diving, tennis, golf).  Poor strength and flexibility.  Inadequate protection.  Previous back injury or surgery (especially  fusion). PREVENTION  Wear properly fitted and padded protective equipment.  Warm up and stretch properly before activity.  Allow for adequate recovery between workouts.  Maintain physical fitness:  Strength, flexibility, and endurance.  Cardiovascular fitness.  Maintain a healthy body weight. PROGNOSIS  If treated properly, low back sprains usually heal with non-surgical treatment. The length of time for healing depends on the severity of the injury.  RELATED COMPLICATIONS   Recurring symptoms, resulting in a chronic problem.  Chronic inflammation and pain in the low back.  Delayed healing or resolution of symptoms, especially if activity is resumed too soon.  Prolonged impairment.  Unstable or arthritic joints of the low back. TREATMENT  Treatment first involves the use of ice and medicine, to reduce pain and inflammation. The use of strengthening and stretching exercises may help reduce pain with activity. These exercises may be performed at home or with a therapist. Severe injuries may require referral to a therapist for further evaluation and treatment, such as ultrasound. Your caregiver may advise that you wear a back brace or corset, to help reduce pain and discomfort. Often, prolonged bed rest results in greater harm then benefit. Corticosteroid injections may be recommended. However, these should be reserved for the most serious cases. It is important to avoid using your back when lifting objects. At night, sleep on your back on a firm mattress, with a pillow placed under your knees. If non-surgical treatment is unsuccessful, surgery may be needed.  MEDICATION   If pain medicine is needed, nonsteroidal anti-inflammatory medicines (aspirin and ibuprofen), or other minor pain relievers (acetaminophen), are often advised.  Do not take pain medicine for 7 days before surgery.  Prescription pain relievers may be given, if your caregiver thinks they are needed. Use only as  directed and only as much as you need.  Ointments applied to the skin may be helpful.  Corticosteroid injections may be given by your caregiver. These injections should be reserved for the most serious cases, because they may only be given a certain number of times. HEAT AND COLD  Cold treatment (icing) should be applied for 10 to 15 minutes  every 2 to 3 hours for inflammation and pain, and immediately after activity that aggravates your symptoms. Use ice packs or an ice massage.  Heat treatment may be used before performing stretching and strengthening activities prescribed by your caregiver, physical therapist, or athletic trainer. Use a heat pack or a warm water soak. SEEK MEDICAL CARE IF:   Symptoms get worse or do not improve in 2 to 4 weeks, despite treatment.  You develop numbness or weakness in either leg.  You lose bowel or bladder function.  Any of the following occur after surgery: fever, increased pain, swelling, redness, drainage of fluids, or bleeding in the affected area.  New, unexplained symptoms develop. (Drugs used in treatment may produce side effects.) EXERCISES  RANGE OF MOTION (ROM) AND STRETCHING EXERCISES - Low Back Sprain Most people with lower back pain will find that their symptoms get worse with excessive bending forward (flexion) or arching at the lower back (extension). The exercises that will help resolve your symptoms will focus on the opposite motion.  Your physician, physical therapist or athletic trainer will help you determine which exercises will be most helpful to resolve your lower back pain. Do not complete any exercises without first consulting with your caregiver. Discontinue any exercises which make your symptoms worse, until you speak to your caregiver. If you have pain, numbness or tingling which travels down into your buttocks, leg or foot, the goal of the therapy is for these symptoms to move closer to your back and eventually resolve.  Sometimes, these leg symptoms will get better, but your lower back pain may worsen. This is often an indication of progress in your rehabilitation. Be very alert to any changes in your symptoms and the activities in which you participated in the 24 hours prior to the change. Sharing this information with your caregiver will allow him or her to most efficiently treat your condition. These exercises may help you when beginning to rehabilitate your injury. Your symptoms may resolve with or without further involvement from your physician, physical therapist or athletic trainer. While completing these exercises, remember:   Restoring tissue flexibility helps normal motion to return to the joints. This allows healthier, less painful movement and activity.  An effective stretch should be held for at least 30 seconds.  A stretch should never be painful. You should only feel a gentle lengthening or release in the stretched tissue. FLEXION RANGE OF MOTION AND STRETCHING EXERCISES: STRETCH - Flexion, Single Knee to Chest   Lie on a firm bed or floor with both legs extended in front of you.  Keeping one leg in contact with the floor, bring your opposite knee to your chest. Hold your leg in place by either grabbing behind your thigh or at your knee.  Pull until you feel a gentle stretch in your low back. Hold __________ seconds.  Slowly release your grasp and repeat the exercise with the opposite side. Repeat __________ times. Complete this exercise __________ times per day.  STRETCH - Flexion, Double Knee to Chest  Lie on a firm bed or floor with both legs extended in front of you.  Keeping one leg in contact with the floor, bring your opposite knee to your chest.  Tense your stomach muscles to support your back and then lift your other knee to your chest. Hold your legs in place by either grabbing behind your thighs or at your knees.  Pull both knees toward your chest until you feel a gentle stretch  in  your low back. Hold __________ seconds.  Tense your stomach muscles and slowly return one leg at a time to the floor. Repeat __________ times. Complete this exercise __________ times per day.  STRETCH - Low Trunk Rotation  Lie on a firm bed or floor. Keeping your legs in front of you, bend your knees so they are both pointed toward the ceiling and your feet are flat on the floor.  Extend your arms out to the side. This will stabilize your upper body by keeping your shoulders in contact with the floor.  Gently and slowly drop both knees together to one side until you feel a gentle stretch in your low back. Hold for __________ seconds.  Tense your stomach muscles to support your lower back as you bring your knees back to the starting position. Repeat the exercise to the other side. Repeat __________ times. Complete this exercise __________ times per day  EXTENSION RANGE OF MOTION AND FLEXIBILITY EXERCISES: STRETCH - Extension, Prone on Elbows   Lie on your stomach on the floor, a bed will be too soft. Place your palms about shoulder width apart and at the height of your head.  Place your elbows under your shoulders. If this is too painful, stack pillows under your chest.  Allow your body to relax so that your hips drop lower and make contact more completely with the floor.  Hold this position for __________ seconds.  Slowly return to lying flat on the floor. Repeat __________ times. Complete this exercise __________ times per day.  RANGE OF MOTION - Extension, Prone Press Ups  Lie on your stomach on the floor, a bed will be too soft. Place your palms about shoulder width apart and at the height of your head.  Keeping your back as relaxed as possible, slowly straighten your elbows while keeping your hips on the floor. You may adjust the placement of your hands to maximize your comfort. As you gain motion, your hands will come more underneath your shoulders.  Hold this position  __________ seconds.  Slowly return to lying flat on the floor. Repeat __________ times. Complete this exercise __________ times per day.  RANGE OF MOTION- Quadruped, Neutral Spine   Assume a hands and knees position on a firm surface. Keep your hands under your shoulders and your knees under your hips. You may place padding under your knees for comfort.  Drop your head and point your tailbone toward the ground below you. This will round out your lower back like an angry cat. Hold this position for __________ seconds.  Slowly lift your head and release your tail bone so that your back sags into a large arch, like an old horse.  Hold this position for __________ seconds.  Repeat this until you feel limber in your low back.  Now, find your "sweet spot." This will be the most comfortable position somewhere between the two previous positions. This is your neutral spine. Once you have found this position, tense your stomach muscles to support your low back.  Hold this position for __________ seconds. Repeat __________ times. Complete this exercise __________ times per day.  STRENGTHENING EXERCISES - Low Back Sprain These exercises may help you when beginning to rehabilitate your injury. These exercises should be done near your "sweet spot." This is the neutral, low-back arch, somewhere between fully rounded and fully arched, that is your least painful position. When performed in this safe range of motion, these exercises can be used for people who have either  a flexion or extension based injury. These exercises may resolve your symptoms with or without further involvement from your physician, physical therapist or athletic trainer. While completing these exercises, remember:   Muscles can gain both the endurance and the strength needed for everyday activities through controlled exercises.  Complete these exercises as instructed by your physician, physical therapist or athletic trainer. Increase  the resistance and repetitions only as guided.  You may experience muscle soreness or fatigue, but the pain or discomfort you are trying to eliminate should never worsen during these exercises. If this pain does worsen, stop and make certain you are following the directions exactly. If the pain is still present after adjustments, discontinue the exercise until you can discuss the trouble with your caregiver. STRENGTHENING - Deep Abdominals, Pelvic Tilt   Lie on a firm bed or floor. Keeping your legs in front of you, bend your knees so they are both pointed toward the ceiling and your feet are flat on the floor.  Tense your lower abdominal muscles to press your low back into the floor. This motion will rotate your pelvis so that your tail bone is scooping upwards rather than pointing at your feet or into the floor. With a gentle tension and even breathing, hold this position for __________ seconds. Repeat __________ times. Complete this exercise __________ times per day.  STRENGTHENING - Abdominals, Crunches   Lie on a firm bed or floor. Keeping your legs in front of you, bend your knees so they are both pointed toward the ceiling and your feet are flat on the floor. Cross your arms over your chest.  Slightly tip your chin down without bending your neck.  Tense your abdominals and slowly lift your trunk high enough to just clear your shoulder blades. Lifting higher can put excessive stress on the lower back and does not further strengthen your abdominal muscles.  Control your return to the starting position. Repeat __________ times. Complete this exercise __________ times per day.  STRENGTHENING - Quadruped, Opposite UE/LE Lift   Assume a hands and knees position on a firm surface. Keep your hands under your shoulders and your knees under your hips. You may place padding under your knees for comfort.  Find your neutral spine and gently tense your abdominal muscles so that you can maintain this  position. Your shoulders and hips should form a rectangle that is parallel with the floor and is not twisted.  Keeping your trunk steady, lift your right hand no higher than your shoulder and then your left leg no higher than your hip. Make sure you are not holding your breath. Hold this position for __________ seconds.  Continuing to keep your abdominal muscles tense and your back steady, slowly return to your starting position. Repeat with the opposite arm and leg. Repeat __________ times. Complete this exercise __________ times per day.  STRENGTHENING - Abdominals and Quadriceps, Straight Leg Raise   Lie on a firm bed or floor with both legs extended in front of you.  Keeping one leg in contact with the floor, bend the other knee so that your foot can rest flat on the floor.  Find your neutral spine, and tense your abdominal muscles to maintain your spinal position throughout the exercise.  Slowly lift your straight leg off the floor about 6 inches for a count of 15, making sure to not hold your breath.  Still keeping your neutral spine, slowly lower your leg all the way to the floor. Repeat this  exercise with each leg __________ times. Complete this exercise __________ times per day. POSTURE AND BODY MECHANICS CONSIDERATIONS - Low Back Sprain Keeping correct posture when sitting, standing or completing your activities will reduce the stress put on different body tissues, allowing injured tissues a chance to heal and limiting painful experiences. The following are general guidelines for improved posture. Your physician or physical therapist will provide you with any instructions specific to your needs. While reading these guidelines, remember:  The exercises prescribed by your provider will help you have the flexibility and strength to maintain correct postures.  The correct posture provides the best environment for your joints to work. All of your joints have less wear and tear when  properly supported by a spine with good posture. This means you will experience a healthier, less painful body.  Correct posture must be practiced with all of your activities, especially prolonged sitting and standing. Correct posture is as important when doing repetitive low-stress activities (typing) as it is when doing a single heavy-load activity (lifting). RESTING POSITIONS Consider which positions are most painful for you when choosing a resting position. If you have pain with flexion-based activities (sitting, bending, stooping, squatting), choose a position that allows you to rest in a less flexed posture. You would want to avoid curling into a fetal position on your side. If your pain worsens with extension-based activities (prolonged standing, working overhead), avoid resting in an extended position such as sleeping on your stomach. Most people will find more comfort when they rest with their spine in a more neutral position, neither too rounded nor too arched. Lying on a non-sagging bed on your side with a pillow between your knees, or on your back with a pillow under your knees will often provide some relief. Keep in mind, being in any one position for a prolonged period of time, no matter how correct your posture, can still lead to stiffness. PROPER SITTING POSTURE In order to minimize stress and discomfort on your spine, you must sit with correct posture. Sitting with good posture should be effortless for a healthy body. Returning to good posture is a gradual process. Many people can work toward this most comfortably by using various supports until they have the flexibility and strength to maintain this posture on their own. When sitting with proper posture, your ears will fall over your shoulders and your shoulders will fall over your hips. You should use the back of the chair to support your upper back. Your lower back will be in a neutral position, just slightly arched. You may place a small  pillow or folded towel at the base of your lower back for  support.  When working at a desk, create an environment that supports good, upright posture. Without extra support, muscles tire, which leads to excessive strain on joints and other tissues. Keep these recommendations in mind: CHAIR:  A chair should be able to slide under your desk when your back makes contact with the back of the chair. This allows you to work closely.  The chair's height should allow your eyes to be level with the upper part of your monitor and your hands to be slightly lower than your elbows. BODY POSITION  Your feet should make contact with the floor. If this is not possible, use a foot rest.  Keep your ears over your shoulders. This will reduce stress on your neck and low back. INCORRECT SITTING POSTURES  If you are feeling tired and unable to assume  a healthy sitting posture, do not slouch or slump. This puts excessive strain on your back tissues, causing more damage and pain. Healthier options include:  Using more support, like a lumbar pillow.  Switching tasks to something that requires you to be upright or walking.  Talking a brief walk.  Lying down to rest in a neutral-spine position. PROLONGED STANDING WHILE SLIGHTLY LEANING FORWARD  When completing a task that requires you to lean forward while standing in one place for a long time, place either foot up on a stationary 2-4 inch high object to help maintain the best posture. When both feet are on the ground, the lower back tends to lose its slight inward curve. If this curve flattens (or becomes too large), then the back and your other joints will experience too much stress, tire more quickly, and can cause pain. CORRECT STANDING POSTURES Proper standing posture should be assumed with all daily activities, even if they only take a few moments, like when brushing your teeth. As in sitting, your ears should fall over your shoulders and your shoulders should  fall over your hips. You should keep a slight tension in your abdominal muscles to brace your spine. Your tailbone should point down to the ground, not behind your body, resulting in an over-extended swayback posture.  INCORRECT STANDING POSTURES  Common incorrect standing postures include a forward head, locked knees and/or an excessive swayback. WALKING Walk with an upright posture. Your ears, shoulders and hips should all line-up. PROLONGED ACTIVITY IN A FLEXED POSITION When completing a task that requires you to bend forward at your waist or lean over a low surface, try to find a way to stabilize 3 out of 4 of your limbs. You can place a hand or elbow on your thigh or rest a knee on the surface you are reaching across. This will provide you more stability, so that your muscles do not tire as quickly. By keeping your knees relaxed, or slightly bent, you will also reduce stress across your lower back. CORRECT LIFTING TECHNIQUES DO :  Assume a wide stance. This will provide you more stability and the opportunity to get as close as possible to the object which you are lifting.  Tense your abdominals to brace your spine. Bend at the knees and hips. Keeping your back locked in a neutral-spine position, lift using your leg muscles. Lift with your legs, keeping your back straight.  Test the weight of unknown objects before attempting to lift them.  Try to keep your elbows locked down at your sides in order get the best strength from your shoulders when carrying an object.  Always ask for help when lifting heavy or awkward objects. INCORRECT LIFTING TECHNIQUES DO NOT:   Lock your knees when lifting, even if it is a small object.  Bend and twist. Pivot at your feet or move your feet when needing to change directions.  Assume that you can safely pick up even a paperclip without proper posture. Document Released: 10/15/2005 Document Revised: 01/07/2012 Document Reviewed: 01/27/2009 [facility]  Patient Information 2015 [place], [place]. This information is not intended to replace advice given to you by your health care provider. Make sure you discuss any questions you have with your health care provider.     I personally performed the services described in this documentation, which was scribed in my presence. The recorded information has been reviewed and considered, and addended by me as needed.

## 2014-11-28 ENCOUNTER — Other Ambulatory Visit: Payer: Self-pay | Admitting: Internal Medicine

## 2014-12-06 ENCOUNTER — Other Ambulatory Visit: Payer: Self-pay | Admitting: Internal Medicine

## 2014-12-20 ENCOUNTER — Ambulatory Visit (INDEPENDENT_AMBULATORY_CARE_PROVIDER_SITE_OTHER): Payer: PRIVATE HEALTH INSURANCE | Admitting: Family Medicine

## 2014-12-20 VITALS — BP 128/84 | HR 79 | Temp 97.7°F | Resp 16 | Ht 71.5 in | Wt 275.0 lb

## 2014-12-20 DIAGNOSIS — T148 Other injury of unspecified body region: Secondary | ICD-10-CM

## 2014-12-20 DIAGNOSIS — S39012A Strain of muscle, fascia and tendon of lower back, initial encounter: Secondary | ICD-10-CM

## 2014-12-20 DIAGNOSIS — M546 Pain in thoracic spine: Secondary | ICD-10-CM

## 2014-12-20 DIAGNOSIS — T148XXA Other injury of unspecified body region, initial encounter: Secondary | ICD-10-CM

## 2014-12-20 DIAGNOSIS — M549 Dorsalgia, unspecified: Secondary | ICD-10-CM

## 2014-12-20 DIAGNOSIS — M62830 Muscle spasm of back: Secondary | ICD-10-CM

## 2014-12-20 MED ORDER — HYDROCODONE-ACETAMINOPHEN 5-325 MG PO TABS: 1.0000 | ORAL_TABLET | Freq: Four times a day (QID) | ORAL | Status: DC | PRN

## 2014-12-20 MED ORDER — CYCLOBENZAPRINE HCL 5 MG PO TABS
ORAL_TABLET | ORAL | Status: DC
Start: 2014-12-20 — End: 2016-08-03

## 2014-12-20 NOTE — Progress Notes (Signed)
Chief Complaint:  Chief Complaint  Patient presents with  . Back Pain    l side     HPI: Gordon Stark is a 49 y.o. male who is here for  Left sided upper back pain since yesterday  after playing golf, he has anpot played golf in several months and went out and played golf , was swinging when he sustained back injury. He denies any numbness, tingling; he denies any prior left  Shoulder injury but has had  Right shoulder dislocation in the past. He has tried otc meds and heating pad. Dull pain, 2-3/10 but is more like 5/10 pain when he raises his shoulders up. Denies any numbness or tingling or weakness.   Past Medical History  Diagnosis Date  . Hypertension   . Dysrhythmia   . Allergy    Past Surgical History  Procedure Laterality Date  . Lithotripsy      32yrs ago  . Cystoscopy      6 times   History   Social History  . Marital Status: Married    Spouse Name: N/A  . Number of Children: N/A  . Years of Education: N/A   Social History Main Topics  . Smoking status: Former Games developer  . Smokeless tobacco: Current User    Types: Chew  . Alcohol Use: 0.0 oz/week    0 Standard drinks or equivalent per week     Comment: 12 pack beer a week  . Drug Use: No  . Sexual Activity: Not on file   Other Topics Concern  . None   Social History Narrative   Family History  Problem Relation Age of Onset  . Cancer Father   . Hypertension Father    Allergies  Allergen Reactions  . Salicylates Other (See Comments)    Lips swell so bad they can bust  . Fioricet [Butalbital-Apap-Caffeine] Palpitations   Prior to Admission medications   Medication Sig Start Date End Date Taking? Authorizing Provider  chlorthalidone (HYGROTON) 25 MG tablet Take 1 tablet (25 mg total) by mouth daily. 08/19/14  Yes Elvina Sidle, MD  diphenhydrAMINE (BENADRYL) 25 MG tablet Take 25 mg by mouth 2 (two) times daily.   Yes Historical Provider, MD  Elastic Bandages & Supports (KNEE BRACE  ADJUSTABLE HINGED) MISC 1 each by Does not apply route every morning. 08/19/14  Yes Elvina Sidle, MD  famotidine (PEPCID) 20 MG tablet Take 20 mg by mouth 2 (two) times daily.   Yes Historical Provider, MD  cyclobenzaprine (FLEXERIL) 5 MG tablet 1 pill by mouth up to every 8 hours as needed. Start with one pill by mouth each bedtime as needed due to sedation Patient not taking: Reported on 12/20/2014 11/02/14   Shade Flood, MD     ROS: The patient denies fevers, chills, night sweats, unintentional weight loss, chest pain, palpitations, wheezing, dyspnea on exertion, nausea, vomiting, abdominal pain, dysuria, hematuria, melena, numbness, weakness, or tingling.   All other systems have been reviewed and were otherwise negative with the exception of those mentioned in the HPI and as above.    PHYSICAL EXAM: Filed Vitals:   12/20/14 1352  BP: 128/84  Pulse: 79  Temp: 97.7 F (36.5 C)  Resp: 16   Filed Vitals:   12/20/14 1352  Height: 5' 11.5" (1.816 m)  Weight: 275 lb (124.739 kg)   Body mass index is 37.82 kg/(m^2).  General: Alert, no acute distress HEENT:  Normocephalic, atraumatic, oropharynx patent. EOMI, PERRLA  Cardiovascular:  Regular rate and rhythm, no rubs murmurs or gallops.  No Carotid bruits, radial pulse intact. No pedal edema.  Respiratory: Clear to auscultation bilaterally.  No wheezes, rales, or rhonchi.  No cyanosis, no use of accessory musculature GI: No organomegaly, abdomen is soft and non-tender, positive bowel sounds.  No masses. Skin: No rashes. Neurologic: Facial musculature symmetric. Psychiatric: Patient is appropriate throughout our interaction. Lymphatic: No cervical lymphadenopathy Musculoskeletal: Gait intact. + paramsk tenderness  Left shoulder blade, midline Decrease ROM Neg [place], Speeds, Empty can, Lift off 5/5 strength, 2/2 Dtrs Neck -PROM is nl, neg spurling  L spine nl exam    LABS: Results for orders placed or performed in  visit on 12/21/13  Basic metabolic panel  Result Value Ref Range   Sodium 136 135 - 145 mEq/L   Potassium 3.4 (L) 3.5 - 5.3 mEq/L   Chloride 97 96 - 112 mEq/L   CO2 31 19 - 32 mEq/L   Glucose, Bld 107 (H) 70 - 99 mg/dL   BUN 9 6 - 23 mg/dL   Creat 1.611.10 0.960.50 - 0.451.35 mg/dL   Calcium 8.9 8.4 - 40.910.5 mg/dL  POCT CBC  Result Value Ref Range   WBC 5.0 4.6 - 10.2 K/uL   Lymph, poc 1.5 0.6 - 3.4   POC LYMPH PERCENT 29.1 10 - 50 %L   MID (cbc) 0.4 0 - 0.9   POC MID % 7.1 0 - 12 %M   POC Granulocyte 3.2 2 - 6.9   Granulocyte percent 63.8 37 - 80 %G   RBC 5.29 4.69 - 6.13 M/uL   Hemoglobin 16.4 14.1 - 18.1 g/dL   HCT, POC 81.151.8 91.443.5 - 53.7 %   MCV 98.0 (A) 80 - 97 fL   MCH, POC 31.0 27 - 31.2 pg   MCHC 31.7 (A) 31.8 - 35.4 g/dL   RDW, POC 78.213.3 %   Platelet Count, POC 171 142 - 424 K/uL   MPV 8.3 0 - 99.8 fL     EKG/XRAY:   [facility]   ASSESSMENT/PLAN: Encounter Diagnoses  Name Primary?  Marland Kitchen. Upper back pain on left side Yes  . Sprain and strain   . Low back strain, initial encounter   . Muscle spasm of back    49 y/o male with left scapular upper back msk spraina dn straina fter trying to swing a golf club after many months of not playing.  Declined xrays Rx Norco, Flexeril ROM exercises Fu prn   Gross sideeffects, risk and benefits, and alternatives of medications d/w patient. Patient is aware that all medications have potential sideeffects and we are unable to predict every sideeffect or drug-drug interaction that may occur.  LE, THAO PHUONG, DO 12/20/2014 2:27 PM

## 2014-12-22 ENCOUNTER — Ambulatory Visit (INDEPENDENT_AMBULATORY_CARE_PROVIDER_SITE_OTHER): Payer: PRIVATE HEALTH INSURANCE | Admitting: Emergency Medicine

## 2014-12-22 VITALS — BP 114/80 | HR 94 | Temp 98.4°F | Resp 18 | Ht 70.0 in | Wt 277.0 lb

## 2014-12-22 DIAGNOSIS — S4382XD Sprain of other specified parts of left shoulder girdle, subsequent encounter: Secondary | ICD-10-CM

## 2014-12-22 NOTE — Patient Instructions (Signed)
Back Pain, Adult Low back pain is very common. About 1 in 5 people have back pain.The cause of low back pain is rarely dangerous. The pain often gets better over time.About half of people with a sudden onset of back pain feel better in just 2 weeks. About 8 in 10 people feel better by 6 weeks.  CAUSES Some common causes of back pain include:  Strain of the muscles or ligaments supporting the spine.  Wear and tear (degeneration) of the spinal discs.  Arthritis.  Direct injury to the back. DIAGNOSIS Most of the time, the direct cause of low back pain is not known.However, back pain can be treated effectively even when the exact cause of the pain is unknown.Answering your caregiver's questions about your overall health and symptoms is one of the most accurate ways to make sure the cause of your pain is not dangerous. If your caregiver needs more information, he or she may order lab work or imaging tests (X-rays or MRIs).However, even if imaging tests show changes in your back, this usually does not require surgery. HOME CARE INSTRUCTIONS For many people, back pain returns.Since low back pain is rarely dangerous, it is often a condition that people can learn to manageon their own.   Remain active. It is stressful on the back to sit or stand in one place. Do not sit, drive, or stand in one place for more than 30 minutes at a time. Take short walks on level surfaces as soon as pain allows.Try to increase the length of time you walk each day.  Do not stay in bed.Resting more than 1 or 2 days can delay your recovery.  Do not avoid exercise or work.Your body is made to move.It is not dangerous to be active, even though your back may hurt.Your back will likely heal faster if you return to being active before your pain is gone.  Pay attention to your body when you bend and lift. Many people have less discomfortwhen lifting if they bend their knees, keep the load close to their bodies,and  avoid twisting. Often, the most comfortable positions are those that put less stress on your recovering back.  Find a comfortable position to sleep. Use a firm mattress and lie on your side with your knees slightly bent. If you lie on your back, put a pillow under your knees.  Only take over-the-counter or prescription medicines as directed by your caregiver. Over-the-counter medicines to reduce pain and inflammation are often the most helpful.Your caregiver may prescribe muscle relaxant drugs.These medicines help dull your pain so you can more quickly return to your normal activities and healthy exercise.  Put ice on the injured area.  Put ice in a plastic bag.  Place a towel between your skin and the bag.  Leave the ice on for 15-20 minutes, 03-04 times a day for the first 2 to 3 days. After that, ice and heat may be alternated to reduce pain and spasms.  Ask your caregiver about trying back exercises and gentle massage. This may be of some benefit.  Avoid feeling anxious or stressed.Stress increases muscle tension and can worsen back pain.It is important to recognize when you are anxious or stressed and learn ways to manage it.Exercise is a great option. SEEK MEDICAL CARE IF:  You have pain that is not relieved with rest or medicine.  You have pain that does not improve in 1 week.  You have new symptoms.  You are generally not feeling well. SEEK   IMMEDIATE MEDICAL CARE IF:   You have pain that radiates from your back into your legs.  You develop new bowel or bladder control problems.  You have unusual weakness or numbness in your arms or legs.  You develop nausea or vomiting.  You develop abdominal pain.  You feel faint. Document Released: 10/15/2005 Document Revised: 04/15/2012 Document Reviewed: 02/16/2014 [employer] Patient Information 2015 [employer]. This information is not intended to replace advice given to you by your health care provider. Make sure you  discuss any questions you have with your health care provider.  

## 2014-12-22 NOTE — Progress Notes (Signed)
[facility] [address] [place] [address] [phone]  Date:  12/22/2014   Name:  Gordon Stark   DOB:  04-Nov-1965   MRN:  [id number]  PCP:  Abbe Amsterdam, MD    Chief Complaint: Back Pain   History of Present Illness:  Gordon Stark is a 49 y.o. very pleasant male patient who presents with the following:  Injured right upper back and seen on Monday.  Still has medications left While leaving the house to go to work this morning, he slipped and fell on the wet grass. His pain in the shoulder immediately worsened. Non radiating No respiratory distress Feels as though he is unable to go to work Denies other complaint or health concern today.   Patient Active Problem List   Diagnosis Date Noted  . Arthritis of knee, left 09/01/2014  . HTN (hypertension) 12/07/2012    Past Medical History  Diagnosis Date  . Hypertension   . Dysrhythmia   . Allergy     Past Surgical History  Procedure Laterality Date  . Lithotripsy      62yrs ago  . Cystoscopy      6 times    History  Substance Use Topics  . Smoking status: Former Games developer  . Smokeless tobacco: Current User    Types: Chew  . Alcohol Use: 0.0 oz/week    0 Standard drinks or equivalent per week     Comment: 12 pack beer a week    Family History  Problem Relation Age of Onset  . Cancer Father   . Hypertension Father     Allergies  Allergen Reactions  . Salicylates Other (See Comments)    Lips swell so bad they can bust  . Fioricet [Butalbital-Apap-Caffeine] Palpitations    Medication list has been reviewed and updated.  Current Outpatient Prescriptions on File Prior to Visit  Medication Sig Dispense Refill  . chlorthalidone (HYGROTON) 25 MG tablet Take 1 tablet (25 mg total) by mouth daily. 90 tablet 3  . cyclobenzaprine (FLEXERIL) 5 MG tablet 1 pill by mouth up to every 8 hours as needed. Start with one pill by mouth each bedtime as needed due to sedation 30 tablet 0   . diphenhydrAMINE (BENADRYL) 25 MG tablet Take 25 mg by mouth 2 (two) times daily.    Clinical research associate Bandages & Supports (KNEE BRACE ADJUSTABLE HINGED) MISC 1 each by Does not apply route every morning. 1 each 1  . famotidine (PEPCID) 20 MG tablet Take 20 mg by mouth 2 (two) times daily.    Marland Kitchen HYDROcodone-acetaminophen (NORCO/VICODIN) 5-325 MG per tablet Take 1 tablet by mouth every 6 (six) hours as needed for moderate pain. 20 tablet 0   No current facility-administered medications on file prior to visit.    Review of Systems:  As per HPI, otherwise negative.    Physical Examination: Filed Vitals:   12/22/14 0920  BP: 114/80  Pulse: 94  Temp: 98.4 F (36.9 C)  Resp: 18   Filed Vitals:   12/22/14 0920  Height:  (1.778 m)  Weight: 277 lb (125.646 kg)   Body mass index is 39.75 kg/(m^2). Ideal Body Weight: Weight in (lb) to have BMI = 25: 173.9   GEN: WDWN, NAD, Non-toxic, Alert & Oriented x 3 HEENT: Atraumatic, Normocephalic.  Ears and Nose: No external deformity. EXTR: No clubbing/cyanosis/edema NEURO: Normal gait.  PSYCH: Normally interactive. Conversant. Not depressed or anxious appearing.  Calm demeanor.  Back tender inferior  and medial to left inferior scapular angle  Assessment and Plan: Scapulocostal strain Continue meds  Signed,  Phillips OdorJeffery Korin Hartwell, MD

## 2015-03-20 ENCOUNTER — Ambulatory Visit (INDEPENDENT_AMBULATORY_CARE_PROVIDER_SITE_OTHER): Payer: PRIVATE HEALTH INSURANCE

## 2015-03-20 ENCOUNTER — Ambulatory Visit (INDEPENDENT_AMBULATORY_CARE_PROVIDER_SITE_OTHER): Payer: PRIVATE HEALTH INSURANCE | Admitting: Emergency Medicine

## 2015-03-20 VITALS — BP 128/80 | HR 82 | Temp 98.8°F | Resp 17 | Ht 71.5 in | Wt 268.0 lb

## 2015-03-20 DIAGNOSIS — M79672 Pain in left foot: Secondary | ICD-10-CM | POA: Diagnosis not present

## 2015-03-20 DIAGNOSIS — Z889 Allergy status to unspecified drugs, medicaments and biological substances status: Secondary | ICD-10-CM

## 2015-03-20 DIAGNOSIS — M7662 Achilles tendinitis, left leg: Secondary | ICD-10-CM

## 2015-03-20 MED ORDER — PREDNISONE 20 MG PO TABS: 60.0000 mg | ORAL_TABLET | Freq: Every day | ORAL | Status: DC

## 2015-03-20 MED ORDER — EPINEPHRINE 0.3 MG/0.3ML IJ SOAJ: 0.3000 mg | Freq: Once | INTRAMUSCULAR | Status: DC

## 2015-03-20 MED ORDER — HYDROCODONE-ACETAMINOPHEN 5-325 MG PO TABS: 1.0000 | ORAL_TABLET | Freq: Four times a day (QID) | ORAL | Status: DC | PRN

## 2015-03-20 NOTE — Patient Instructions (Addendum)
Please ice 3x times per day for 15 minutes.  Await referral for orthopedic consult.  If you are feeling resolved and recovered, you can cancel, or don't accept the referral.  If you have any fever, nausea, increased swelling, come in immediately.  If you are not having any improvement, you may come in or contact me, so I can contact referral for sooner appointment.   Achilles Tendinitis  with Rehab Achilles tendinitis is a disorder of the Achilles tendon. The Achilles tendon connects the large calf muscles (Gastrocnemius and Soleus) to the heel bone (calcaneus). This tendon is sometimes called the heel cord. It is important for pushing-off and standing on your toes and is important for walking, running, or jumping. Tendinitis is often caused by overuse and repetitive microtrauma. SYMPTOMS  Pain, tenderness, swelling, warmth, and redness may occur over the Achilles tendon even at rest.  Pain with pushing off, or flexing or extending the ankle.  Pain that is worsened after or during activity. CAUSES   Overuse sometimes seen with rapid increase in exercise programs or in sports requiring running and jumping.  Poor physical conditioning (strength and flexibility or endurance).  Running sports, especially training running down hills.  Inadequate warm-up before practice or play or failure to stretch before participation.  Injury to the tendon. PREVENTION   Warm up and stretch before practice or competition.  Allow time for adequate rest and recovery between practices and competition.  Keep up conditioning.  Keep up ankle and leg flexibility.  Improve or keep muscle strength and endurance.  Improve cardiovascular fitness.  Use proper technique.  Use proper equipment (shoes, skates).  To help prevent recurrence, taping, protective strapping, or an adhesive bandage may be recommended for several weeks after healing is complete. PROGNOSIS   Recovery may take weeks to several months  to heal.  Longer recovery is expected if symptoms have been prolonged.  Recovery is usually quicker if the inflammation is due to a direct blow as compared with overuse or sudden strain. RELATED COMPLICATIONS   Healing time will be prolonged if the condition is not correctly treated. The injury must be given plenty of time to heal.  Symptoms can reoccur if activity is resumed too soon.  Untreated, tendinitis may increase the risk of tendon rupture requiring additional time for recovery and possibly surgery. TREATMENT   The first treatment consists of rest anti-inflammatory medication, and ice to relieve the pain.  Stretching and strengthening exercises after resolution of pain will likely help reduce the risk of recurrence. Referral to a physical therapist or athletic trainer for further evaluation and treatment may be helpful.  A walking boot or cast may be recommended to rest the Achilles tendon. This can help break the cycle of inflammation and microtrauma.  Arch supports (orthotics) may be prescribed or recommended by your caregiver as an adjunct to therapy and rest.  Surgery to remove the inflamed tendon lining or degenerated tendon tissue is rarely necessary and has shown less than predictable results. MEDICATION   Nonsteroidal anti-inflammatory medications, such as aspirin and ibuprofen, may be used for pain and inflammation relief. Do not take within 7 days before surgery. Take these as directed by your caregiver. Contact your caregiver immediately if any bleeding, stomach upset, or signs of allergic reaction occur. Other minor pain relievers, such as acetaminophen, may also be used.  Pain relievers may be prescribed as necessary by your caregiver. Do not take prescription pain medication for longer than 4 to 7 days. Use  only as directed and only as much as you need.  Cortisone injections are rarely indicated. Cortisone injections may weaken tendons and predispose to rupture. It  is better to give the condition more time to heal than to use them. HEAT AND COLD  Cold is used to relieve pain and reduce inflammation for acute and chronic Achilles tendinitis. Cold should be applied for 10 to 15 minutes every 2 to 3 hours for inflammation and pain and immediately after any activity that aggravates your symptoms. Use ice packs or an ice massage.  Heat may be used before performing stretching and strengthening activities prescribed by your caregiver. Use a heat pack or a warm soak. SEEK MEDICAL CARE IF:  Symptoms get worse or do not improve in 2 weeks despite treatment.  New, unexplained symptoms develop. Drugs used in treatment may produce side effects. EXERCISES RANGE OF MOTION (ROM) AND STRETCHING EXERCISES - Achilles Tendinitis  These exercises may help you when beginning to rehabilitate your injury. Your symptoms may resolve with or without further involvement from your physician, physical therapist or athletic trainer. While completing these exercises, remember:   Restoring tissue flexibility helps normal motion to return to the joints. This allows healthier, less painful movement and activity.  An effective stretch should be held for at least 30 seconds.  A stretch should never be painful. You should only feel a gentle lengthening or release in the stretched tissue. STRETCH - Gastroc, Standing   Place hands on wall.  Extend right / left leg, keeping the front knee somewhat bent.  Slightly point your toes inward on your back foot.  Keeping your right / left heel on the floor and your knee straight, shift your weight toward the wall, not allowing your back to arch.  You should feel a gentle stretch in the right / left calf. Hold this position for __________ seconds. Repeat __________ times. Complete this stretch __________ times per day. STRETCH - Soleus, Standing   Place hands on wall.  Extend right / left leg, keeping the other knee somewhat  bent.  Slightly point your toes inward on your back foot.  Keep your right / left heel on the floor, bend your back knee, and slightly shift your weight over the back leg so that you feel a gentle stretch deep in your back calf.  Hold this position for __________ seconds. Repeat __________ times. Complete this stretch __________ times per day. STRETCH - Gastrocsoleus, Standing  Note: This exercise can place a lot of stress on your foot and ankle. Please complete this exercise only if specifically instructed by your caregiver.   Place the ball of your right / left foot on a step, keeping your other foot firmly on the same step.  Hold on to the wall or a rail for balance.  Slowly lift your other foot, allowing your body weight to press your heel down over the edge of the step.  You should feel a stretch in your right / left calf.  Hold this position for __________ seconds.  Repeat this exercise with a slight bend in your knee. Repeat __________ times. Complete this stretch __________ times per day.  STRENGTHENING EXERCISES - Achilles Tendinitis These exercises may help you when beginning to rehabilitate your injury. They may resolve your symptoms with or without further involvement from your physician, physical therapist or athletic trainer. While completing these exercises, remember:   Muscles can gain both the endurance and the strength needed for everyday activities through controlled exercises.  Complete these exercises as instructed by your physician, physical therapist or athletic trainer. Progress the resistance and repetitions only as guided.  You may experience muscle soreness or fatigue, but the pain or discomfort you are trying to eliminate should never worsen during these exercises. If this pain does worsen, stop and make certain you are following the directions exactly. If the pain is still present after adjustments, discontinue the exercise until you can discuss the trouble  with your clinician. STRENGTH - Plantar-flexors   Sit with your right / left leg extended. Holding onto both ends of a rubber exercise band/tubing, loop it around the ball of your foot. Keep a slight tension in the band.  Slowly push your toes away from you, pointing them downward.  Hold this position for __________ seconds. Return slowly, controlling the tension in the band/tubing. Repeat __________ times. Complete this exercise __________ times per day.  STRENGTH - Plantar-flexors   Stand with your feet shoulder width apart. Steady yourself with a wall or table using as little support as needed.  Keeping your weight evenly spread over the width of your feet, rise up on your toes.*  Hold this position for __________ seconds. Repeat __________ times. Complete this exercise __________ times per day.  *If this is too easy, shift your weight toward your right / left leg until you feel challenged. Ultimately, you may be asked to do this exercise with your right / left foot only. STRENGTH - Plantar-flexors, Eccentric  Note: This exercise can place a lot of stress on your foot and ankle. Please complete this exercise only if specifically instructed by your caregiver.   Place the balls of your feet on a step. With your hands, use only enough support from a wall or rail to keep your balance.  Keep your knees straight and rise up on your toes.  Slowly shift your weight entirely to your right / left toes and pick up your opposite foot. Gently and with controlled movement, lower your weight through your right / left foot so that your heel drops below the level of the step. You will feel a slight stretch in the back of your calf at the end position.  Use the healthy leg to help rise up onto the balls of both feet, then lower weight only on the right / left leg again. Build up to 15 repetitions. Then progress to 3 consecutive sets of 15 repetitions.*  After completing the above exercise, complete the  same exercise with a slight knee bend (about 30 degrees). Again, build up to 15 repetitions. Then progress to 3 consecutive sets of 15 repetitions.* Perform this exercise __________ times per day.  *When you easily complete 3 sets of 15, your physician, physical therapist or athletic trainer may advise you to add resistance by wearing a backpack filled with additional weight. STRENGTH - Plantar Flexors, Seated   Sit on a chair that allows your feet to rest flat on the ground. If necessary, sit at the edge of the chair.  Keeping your toes firmly on the ground, lift your right / left heel as far as you can without increasing any discomfort in your ankle. Repeat __________ times. Complete this exercise __________ times a day. *If instructed by your physician, physical therapist or athletic trainer, you may add ____________________ of resistance by placing a weighted object on your right / left knee. Document Released: 05/16/2005 Document Revised: 01/07/2012 Document Reviewed: 01/27/2009 [facility] Patient Information 2015 [place], [place]. This information is not intended  to replace advice given to you by your health care provider. Make sure you discuss any questions you have with your health care provider.  

## 2015-03-20 NOTE — Progress Notes (Signed)
[facility] [address], [place] [address] [phone]  Date:  03/20/2015   Name:  Gordon Stark   DOB:  11-Nov-1965   MRN:  [id number]  PCP:  Abbe Amsterdam, MD    History of Present Illness:  Gordon Stark is a 49 y.o. male patient who presents to [facility] with chief complaint of left heel pain. 3 days ago, he had a pain in his heel about 6 hours into work.  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  This progressively worsened.  It is aggravated by dorsiflexion with stairs and standing on his toes.  It is relieved by keeping slight plantar flexion with the foot on the floor.  He denies any trauma.  He felt no pop, or pull initially.  He has pain upon his first steps after rest, and by the end of the day.  He has applied ice which has helped very little.  He has attempted tylenol and hydrocodone, which make it bearable.  There is   Tylenol, hydrocodone which made it bearable.  The day before that he had a pedicure.  He wears rockports to work with gel insertions.  He has never had trauma at this foot.    He is also asking for replacement of his two outdated EpiPen.  He has a hx of anaphylactic reaction with salicylic ingestion.  No reactions recently or use of the EpiPen.   Patient Active Problem List   Diagnosis Date Noted  . Arthritis of knee, left 09/01/2014  . HTN (hypertension) 12/07/2012    Past Medical History  Diagnosis Date  . Hypertension   . Dysrhythmia   . Allergy     Past Surgical History  Procedure Laterality Date  . Lithotripsy      24yrs ago  . Cystoscopy      6 times    History  Substance Use Topics  . Smoking status: Former Games developer  . Smokeless tobacco: Current User    Types: Chew  . Alcohol Use: 0.0 oz/week    0 Standard drinks or equivalent per week     Comment: 12 pack beer a week    Family History  Problem Relation Age of Onset  . Cancer Father   . Hypertension Father     Allergies   Allergen Reactions  . Salicylates Other (See Comments)    Lips swell so bad they can bust  . Fioricet [Butalbital-Apap-Caffeine] Palpitations    Medication list has been reviewed and updated.  Current Outpatient Prescriptions on File Prior to Visit  Medication Sig Dispense Refill  . diphenhydrAMINE (BENADRYL) 25 MG tablet Take 25 mg by mouth 2 (two) times daily.    Clinical research associate Bandages & Supports (KNEE BRACE ADJUSTABLE HINGED) MISC 1 each by Does not apply route every morning. 1 each 1  . famotidine (PEPCID) 20 MG tablet Take 20 mg by mouth 2 (two) times daily.    . cyclobenzaprine (FLEXERIL) 5 MG tablet 1 pill by mouth up to every 8 hours as needed. Start with one pill by mouth each bedtime as needed due to sedation (Patient not taking: Reported on 03/20/2015) 30 tablet 0   No current facility-administered medications on file prior to visit.    ROS ROS otherwise unremarkable unless listed above.    Physical Examination: BP 128/80 mmHg  Pulse 82  Temp(Src) 98.8 F (37.1 C) (Oral)  Resp 17  Ht 5' 11.5" (1.816 m)  Wt 268 lb (121.564 kg)  BMI 36.86 kg/m2  SpO2 98% Ideal Body Weight: Weight in (lb) to have BMI = 25: 181.4  Physical Exam  Constitutional: He is oriented to person, place, and time. He appears well-developed and well-nourished. No distress.  HENT:  Head: Normocephalic and atraumatic.  Eyes: Pupils are equal, round, and reactive to light.  Cardiovascular: Normal rate.   Pulses:      Dorsalis pedis pulses are 2+ on the right side, and 2+ on the left side.  Pulmonary/Chest: Effort normal. No respiratory distress.  Musculoskeletal:       Left foot: There is normal capillary refill and no crepitus.  Very tender erythematous swelling at the achilles tendon.  Slight decrease in ROM with active dorsiflexion.  Passive ROM with tenderness along the heel.  No tenderness around the malleolus.  Not able to raise into plantar flexion.  Negative thompson test.      Neurological:  He is alert and oriented to person, place, and time.  Skin: Skin is warm and dry.  Psychiatric: He has a normal mood and affect. His behavior is normal.     [facility] reading (PRIMARY) by  Dr. Dareen PianoAnderson: Normal  Assessment and Plan: 49 year old male is here today for chief complaint of left heel pain.  Most suspicious of achilles tendinitis.   -Placed in cam walker boot.   -Advised ice and rest for next days, then followed by light achilles stretches that were instructed both verbally and in handout.  -Refilled outdated Epipen.  Achilles tendinitis of left lower extremity - Plan: predniSONE (DELTASONE) 20 MG tablet, AMB referral to orthopedics, HYDROcodone-acetaminophen (NORCO) 5-325 MG per tablet  Left foot pain - Plan: DG [place]cis Left, predniSONE (DELTASONE) 20 MG tablet  Hx of allergic reaction - Plan: EPINEPHrine (EPIPEN 2-PAK) 0.3 mg/0.3 mL IJ SOAJ injection  Trena PlattStephanie Jorden Minchey, PA-C [facility] 5/25/20168:02 AM

## 2015-03-23 NOTE — Progress Notes (Signed)
  Medical screening examination/treatment/procedure(s) were performed by non-physician practitioner and as supervising physician I was immediately available for consultation/collaboration.     

## 2015-09-14 ENCOUNTER — Ambulatory Visit (INDEPENDENT_AMBULATORY_CARE_PROVIDER_SITE_OTHER): Admitting: Family Medicine

## 2015-09-14 VITALS — BP 126/96 | HR 72 | Temp 98.3°F | Resp 16 | Ht 71.5 in

## 2015-09-14 DIAGNOSIS — M7662 Achilles tendinitis, left leg: Secondary | ICD-10-CM

## 2015-09-14 DIAGNOSIS — M722 Plantar fascial fibromatosis: Secondary | ICD-10-CM | POA: Diagnosis not present

## 2015-09-14 MED ORDER — HYDROCODONE-ACETAMINOPHEN 5-325 MG PO TABS: 1.0000 | ORAL_TABLET | Freq: Four times a day (QID) | ORAL | Status: DC | PRN

## 2015-09-14 MED ORDER — PREDNISONE 20 MG PO TABS: ORAL_TABLET | ORAL | Status: DC

## 2015-09-14 NOTE — Patient Instructions (Signed)
Plantar Fasciitis Plantar fasciitis is a painful foot condition that affects the heel. It occurs when the band of tissue that connects the toes to the heel bone (plantar fascia) becomes irritated. This can happen after exercising too much or doing other repetitive activities (overuse injury). The pain from plantar fasciitis can range from mild irritation to severe pain that makes it difficult for you to walk or move. The pain is usually worse in the morning or after you have been sitting or lying down for a while. CAUSES This condition may be caused by:  Standing for long periods of time.  Wearing shoes that do not fit.  Doing high-impact activities, including running, aerobics, and ballet.  Being overweight.  Having an abnormal way of walking (gait).  Having tight calf muscles.  Having high arches in your feet.  Starting a new athletic activity. SYMPTOMS The main symptom of this condition is heel pain. Other symptoms include:  Pain that gets worse after activity or exercise.  Pain that is worse in the morning or after resting.  Pain that goes away after you walk for a few minutes. DIAGNOSIS This condition may be diagnosed based on your signs and symptoms. Your health care provider will also do a physical exam to check for:  A tender area on the bottom of your foot.  A high arch in your foot.  Pain when you move your foot.  Difficulty moving your foot. You may also need to have imaging studies to confirm the diagnosis. These can include:  X-rays.  Ultrasound.  MRI. TREATMENT  Treatment for plantar fasciitis depends on the severity of the condition. Your treatment may include:  Rest, ice, and over-the-counter pain medicines to manage your pain.  Exercises to stretch your calves and your plantar fascia.  A splint that holds your foot in a stretched, upward position while you sleep (night splint).  Physical therapy to relieve symptoms and prevent problems in the  future.  Cortisone injections to relieve severe pain.  Extracorporeal shock wave therapy (ESWT) to stimulate damaged plantar fascia with electrical impulses. It is often used as a last resort before surgery.  Surgery, if other treatments have not worked after 12 months. HOME CARE INSTRUCTIONS  Take medicines only as directed by your health care provider.  Avoid activities that cause pain.  Roll the bottom of your foot over a bag of ice or a bottle of cold water. Do this for 20 minutes, 3-4 times a day.  Perform simple stretches as directed by your health care provider.  Try wearing athletic shoes with air-sole or gel-sole cushions or soft shoe inserts.  Wear a night splint while sleeping, if directed by your health care provider.  Keep all follow-up appointments with your health care provider. PREVENTION   Do not perform exercises or activities that cause heel pain.  Consider finding low-impact activities if you continue to have problems.  Lose weight if you need to. The best way to prevent plantar fasciitis is to avoid the activities that aggravate your plantar fascia. SEEK MEDICAL CARE IF:  Your symptoms do not go away after treatment with home care measures.  Your pain gets worse.  Your pain affects your ability to move or do your daily activities.   This information is not intended to replace advice given to you by your health care provider. Make sure you discuss any questions you have with your health care provider.   Document Released: 07/10/2001 Document Revised: 07/06/2015 Document Reviewed: 08/25/2014 Elsevier   Interactive Patient Education 2016 Elsevier Inc.  

## 2015-09-14 NOTE — Progress Notes (Signed)
@  [facility]@  This chart was scribed for Elvina SidleKurt Anastasya Jewell, MD by Andrew Auaven Small, ED Scribe. This patient was seen in room 8 and the patient's care was started at 5:01 PM.  Patient ID: Gordon Stark MRN: [id number], DOB: 24-Mar-1966, 49 y.o. Date of Encounter: 09/14/2015, 4:56 PM  Primary Physician: Abbe AmsterdamOPLAND,JESSICA, MD  Chief Complaint: Chief Complaint  Patient presents with  . Foot Injury    left foot, heel has been hurting him for over x 3 months     HPI: 49 y.o. year old male with history below presents with left foot heel. Pt states for the past 3 months he's had gradually worsening left heel pain. 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 He usually works 50-60 hours a weeks but has missed a few days a work, only work 40 hours a week due to pain. States by the end of shift he has to "crawl" to his truck. Other co-workers also have been having foot pain. He has been taken 8-10 tylenol a day without relief to pain. He has bought new shoes and gel inserts with temporary relief.   Review of Systems: Constitutional: negative for chills, fever, night sweats, weight changes, or fatigue  HEENT: negative for vision changes, hearing loss, congestion, rhinorrhea, ST, epistaxis, or sinus pressure Cardiovascular: negative for chest pain or palpitations Respiratory: negative for hemoptysis, wheezing, shortness of breath, or cough Abdominal: negative for abdominal pain, nausea, vomiting, diarrhea, or constipation Dermatological: negative for rash Neurologic: negative for headache, dizziness, or syncope All other systems reviewed and are otherwise negative with the exception to those above and in the HPI.   Physical Exam: Blood pressure 126/96, pulse 72, temperature 98.3 F (36.8 C), temperature source Oral, resp. rate 16, height 5' 11.5" (1.816 m), SpO2 97 %., There is no weight on file to calculate BMI. General: Well developed, well nourished, in no acute  distress. Head: Normocephalic, atraumatic, eyes without discharge, sclera non-icteric, nares are without discharge. Bilateral auditory canals clear, TM's are without perforation, pearly grey and translucent with reflective cone of light bilaterally. Oral cavity moist, posterior pharynx without exudate, erythema, peritonsillar abscess, or post nasal drip.  Neck: Supple. No thyromegaly. Full ROM. No lymphadenopathy. Lungs: Clear bilaterally to auscultation without wheezes, rales, or rhonchi. Breathing is unlabored. Heart: RRR with S1 S2. No murmurs, rubs, or gallops appreciated. Abdomen: Soft, non-tender, non-distended with normoactive bowel sounds. No hepatomegaly. No rebound/guarding. No obvious abdominal masses. Msk:  Strength and tone normal for age. Left heel tender without swelling.  Extremities/Skin: Warm and dry. No clubbing or cyanosis. No edema. No rashes or suspicious lesions. Neuro: Alert and oriented X 3. Moves all extremities spontaneously. Gait is normal. CNII-XII grossly in tact. Psych:  Responds to questions appropriately with a normal affect.   ASSESSMENT AND PLAN:  49 y.o. year old male with     ICD-9-CM ICD-10-CM   1. Plantar fasciitis of left foot 728.71 M72.2 predniSONE (DELTASONE) 20 MG tablet  2. Achilles tendinitis of left lower extremity 726.71 M76.62 HYDROcodone-acetaminophen (NORCO) 5-325 MG tablet   This chart was scribed in my presence and reviewed by me personally.   Signed, Elvina SidleKurt Zyiere Rosemond, MD   By signing my name below, I, Raven Small, attest that this documentation has been prepared under the direction and in the presence of Elvina SidleKurt Cyrene Gharibian, MD.  Electronically Signed: Andrew Auaven Small, ED Scribe. 09/14/2015. 5:03 PM.  Signed, Elvina SidleKurt Mitul Hallowell, MD 09/14/2015 4:56 PM

## 2015-09-21 ENCOUNTER — Telehealth: Payer: Self-pay

## 2015-09-21 NOTE — Telephone Encounter (Signed)
Patient is calling because he was seen last week end never received the blood pressure medication. Patient states that he called and still hasn't heard anything. Medication is chlorathylirizone.???  [phone] [facility] [address] [phone]

## 2015-09-23 NOTE — Telephone Encounter (Signed)
chlorthalidone (HYGROTON) 25 MG tablet Reorder      Can we Rx? Pt has not been seen for HTN since 08/19/2014.

## 2015-09-24 ENCOUNTER — Other Ambulatory Visit: Payer: Self-pay | Admitting: Family Medicine

## 2015-09-24 DIAGNOSIS — I10 Essential (primary) hypertension: Secondary | ICD-10-CM

## 2015-09-24 MED ORDER — CHLORTHALIDONE 25 MG PO TABS: 25.0000 mg | ORAL_TABLET | Freq: Every day | ORAL | Status: DC

## 2015-11-18 ENCOUNTER — Encounter: Payer: Self-pay | Admitting: Family Medicine

## 2015-11-23 ENCOUNTER — Encounter: Payer: Self-pay | Admitting: Family Medicine

## 2016-01-25 ENCOUNTER — Telehealth: Payer: Self-pay

## 2016-01-25 NOTE — Telephone Encounter (Signed)
Patient needs FMLA forms updated for the year about his left knee issues, I have completed what I can from the old FMLA forms and higlighted the area that needs to be signed. I will place these in your box on 01/25/16 if you could please return them to the FMLA/Disability box at the 102 checkout desk within 5-7 business days. Thank you  Also the patient included a note asking for a new RX for a knee brace, states he wears a hinged knee brace daily and needs a new one. Thank you

## 2016-01-26 NOTE — Telephone Encounter (Signed)
Paperwork completed and patient called to come pick up will place in the pick up draw at 102.

## 2016-02-07 DIAGNOSIS — Z0271 Encounter for disability determination: Secondary | ICD-10-CM

## 2016-05-14 ENCOUNTER — Other Ambulatory Visit: Payer: Self-pay | Admitting: Family Medicine

## 2016-06-02 IMAGING — CR DG OS CALCIS 2+V*L*
2 series · 2 of 2 positions shown · non-contrast
Comparison: None.

CLINICAL DATA: Heel pain of unknown origin.

EXAM:
LEFT OS CALCIS - 2+ VIEW

[axial]
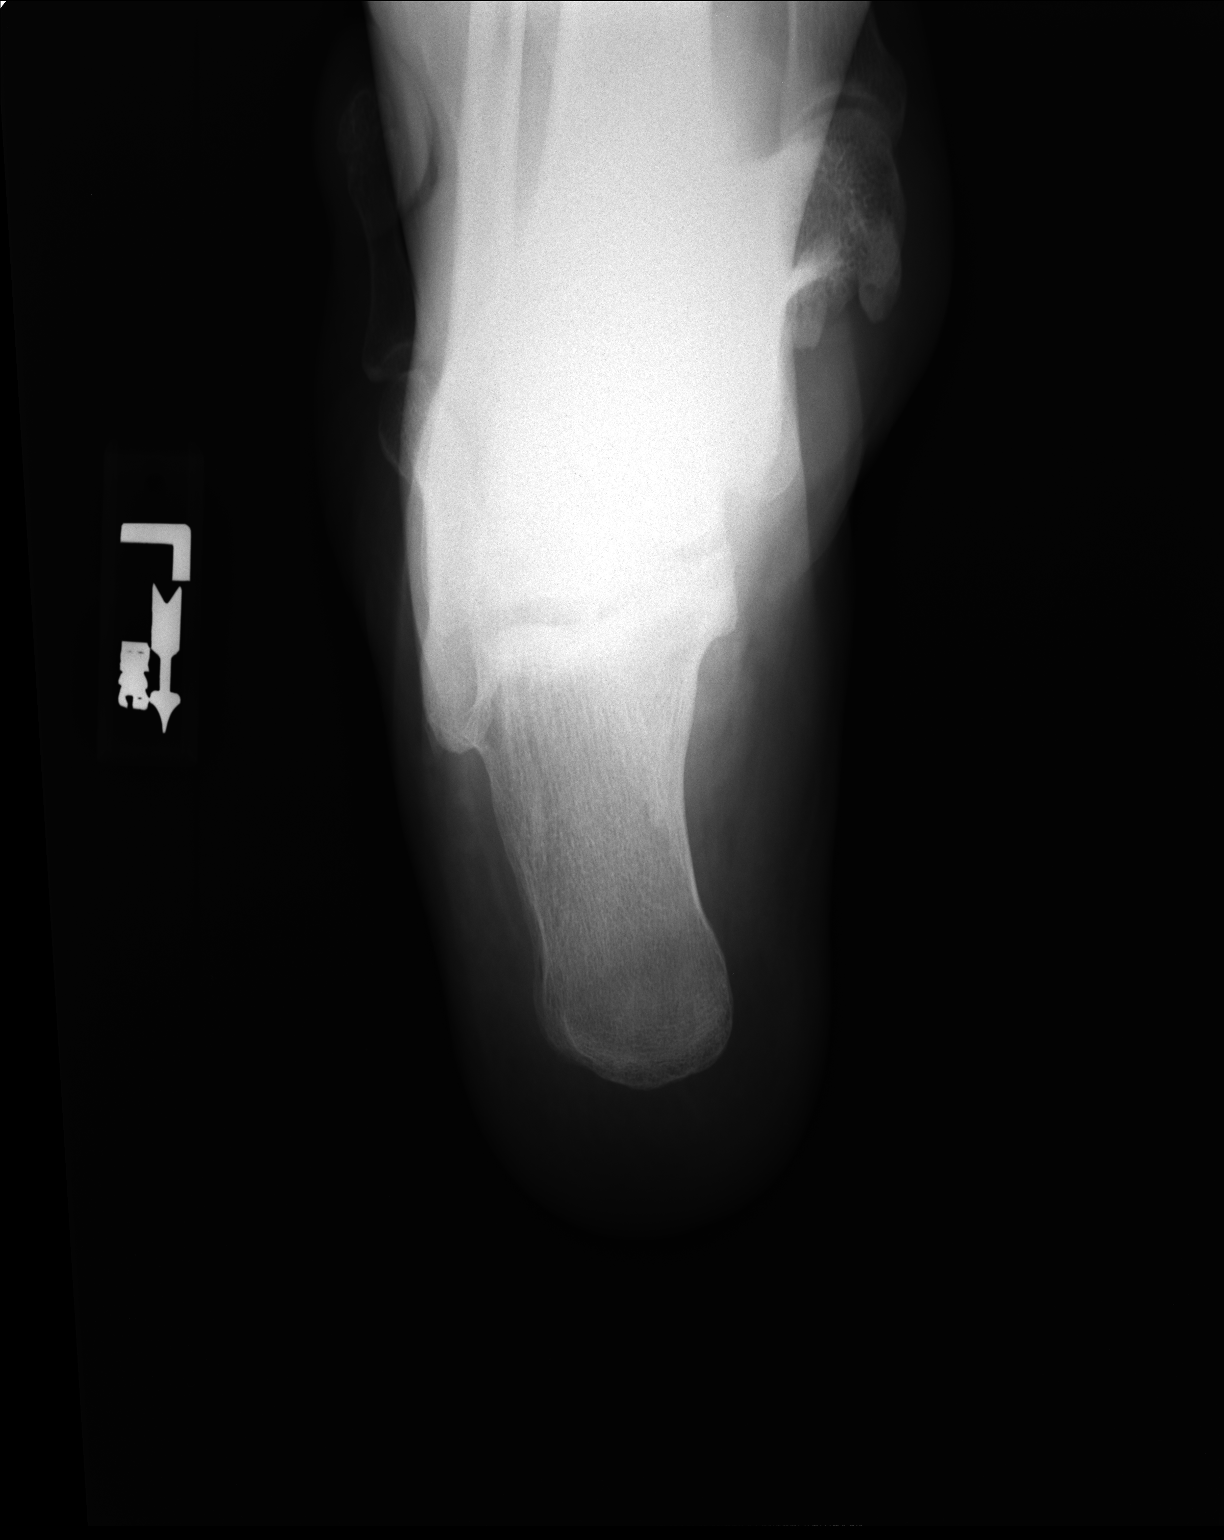

[lateral]
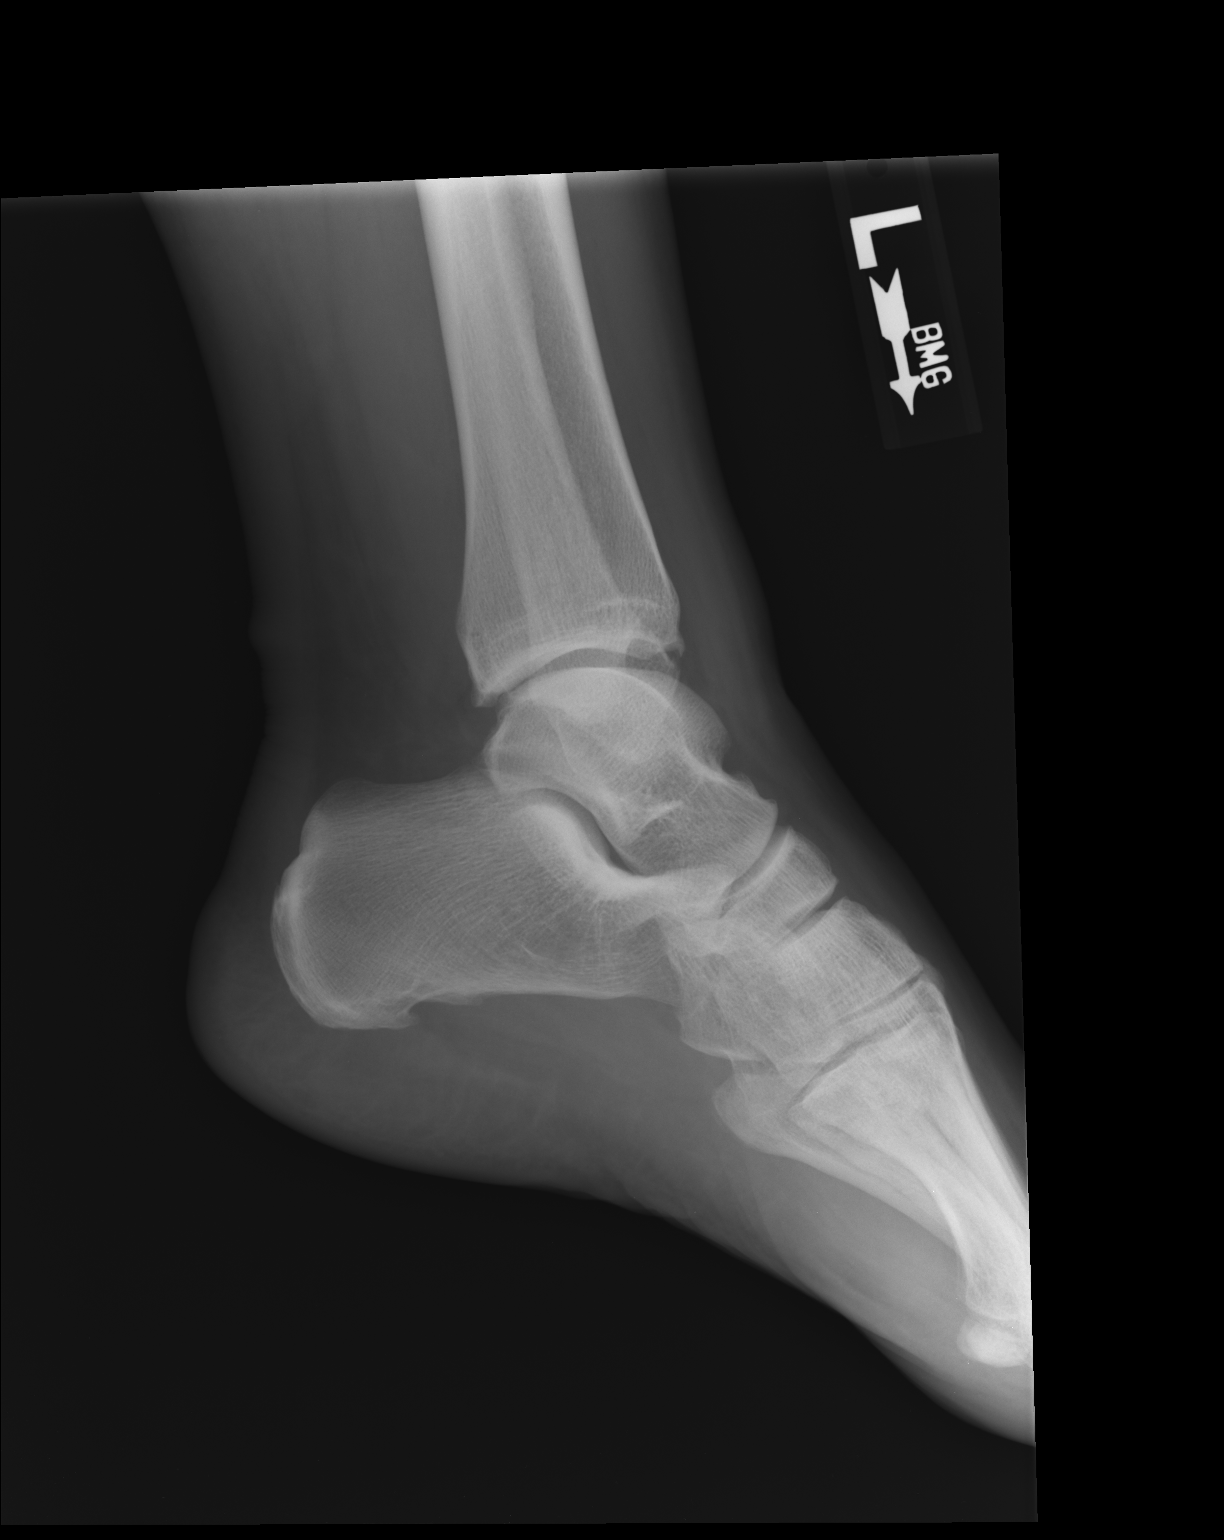

[2 of 2 positions shown; findings below may reference images not displayed]

FINDINGS: There is no evidence of fracture or other focal bone lesions. Soft
tissues are unremarkable. Small plantar calcaneal spur.
IMPRESSION: No acute bony abnormality.  Small calcaneal spur.

## 2016-08-03 ENCOUNTER — Ambulatory Visit (INDEPENDENT_AMBULATORY_CARE_PROVIDER_SITE_OTHER): Payer: PRIVATE HEALTH INSURANCE | Admitting: Physician Assistant

## 2016-08-03 VITALS — BP 128/90 | HR 82 | Temp 98.7°F | Resp 18 | Ht 70.5 in | Wt 266.8 lb

## 2016-08-03 DIAGNOSIS — H6690 Otitis media, unspecified, unspecified ear: Secondary | ICD-10-CM

## 2016-08-03 DIAGNOSIS — R05 Cough: Secondary | ICD-10-CM

## 2016-08-03 DIAGNOSIS — R0981 Nasal congestion: Secondary | ICD-10-CM | POA: Diagnosis not present

## 2016-08-03 DIAGNOSIS — I10 Essential (primary) hypertension: Secondary | ICD-10-CM

## 2016-08-03 DIAGNOSIS — R059 Cough, unspecified: Secondary | ICD-10-CM

## 2016-08-03 LAB — COMPLETE METABOLIC PANEL WITH GFR
ALBUMIN: 4 g/dL (ref 3.6–5.1)
ALK PHOS: 51 U/L (ref 40–115)
ALT: 24 U/L (ref 9–46)
AST: 16 U/L (ref 10–40)
BUN: 10 mg/dL (ref 7–25)
CALCIUM: 9.5 mg/dL (ref 8.6–10.3)
CO2: 29 mmol/L (ref 20–31)
CREATININE: 1.05 mg/dL (ref 0.60–1.35)
Chloride: 99 mmol/L (ref 98–110)
GFR, Est African American: >89 mL/min (ref >=60)
GFR, Est Non African American: 83 mL/min (ref >=60)
Glucose, Bld: 112 mg/dL — ABNORMAL HIGH (ref 65–99)
Potassium: 3.4 mmol/L — ABNORMAL LOW (ref 3.5–5.3)
Sodium: 140 mmol/L (ref 135–146)
Total Bilirubin: 0.8 mg/dL (ref 0.2–1.2)
Total Protein: 7.2 g/dL (ref 6.1–8.1)

## 2016-08-03 LAB — POCT CBC
Granulocyte percent: 65.5 %G (ref 37–80)
HCT, POC: 43.7 % (ref 43.5–53.7)
HEMOGLOBIN: 15.7 g/dL (ref 14.1–18.1)
LYMPH, POC: 2 (ref 0.6–3.4)
MCH, POC: 32.6 pg — AB (ref 27–31.2)
MCHC: 35.9 g/dL — AB (ref 31.8–35.4)
MCV: 90.7 fL (ref 80–97)
MID (CBC): 1 — AB (ref 0–0.9)
MPV: 6.8 fL (ref 0–99.8)
POC Granulocyte: 5.7 (ref 2–6.9)
POC LYMPH %: 23.4 % (ref 10–50)
POC MID %: 11.1 % (ref 0–12)
Platelet Count, POC: 196 10*3/uL (ref 142–424)
RBC: 4.81 M/uL (ref 4.69–6.13)
RDW, POC: 13.2 %
WBC: 8.7 10*3/uL (ref 4.6–10.2)

## 2016-08-03 MED ORDER — CHLORTHALIDONE 25 MG PO TABS: ORAL_TABLET | ORAL | 0 refills | Status: DC

## 2016-08-03 MED ORDER — FLUTICASONE PROPIONATE 50 MCG/ACT NA SUSP: 2.0000 | Freq: Every day | NASAL | 0 refills | Status: DC

## 2016-08-03 MED ORDER — HYDROCOD POLST-CPM POLST ER 10-8 MG/5ML PO SUER: 5.0000 mL | Freq: Two times a day (BID) | ORAL | 0 refills | Status: DC | PRN

## 2016-08-03 MED ORDER — AMOXICILLIN 500 MG PO CAPS: 500.0000 mg | ORAL_CAPSULE | Freq: Two times a day (BID) | ORAL | 0 refills | Status: AC

## 2016-08-03 NOTE — Progress Notes (Signed)
MRN: [id number] DOB: [birth year]  Subjective:   Gordon Stark is a 50 y.o. male presenting for chief complaint of Cough (Sinuses and chest congestion); Sore Throat; and Fever (103 (Tuesday)) .  Reports 4  history of sore throat, sinus and nasal congestion. It moved into his chest last night and is now having chest congestion and productive cough. Has bright green  discharge from both his nose and throat. Has associated Fever (103), left ear fullness, night sweats, chills, fatigue and malaise. Has tried tylenol, bourbon mixture, and throat lozenges with mild relief. Deniesitchy watery eyes, red eyes, wheezing, shortness of breath and chest pain, weight loss, nausea, vomiting, abdominal pain and diarrhea. Has had sick contact with both wife and son. His son is currently on antibiotics for sinus infection.Marland Kitchen Has history of seasonal allergies. No history of asthma or COPD. Patient has not had flu shot this season. Denies smoking. Denies any other aggravating or relieving factors, no other questions or concerns.  Pt also needs refill for bp medication because he is almost out. He has been controlled on chlorthalidone 25mg  daily for over 8 years. He does not check his bp at home because it is has been regular for so long. Denies headache, visual disturbance, SOB, heart palpitations, nausea, vomiting, leg swelling, and chest pain.   Gordon Stark has a current medication list which includes the following prescription(s): chlorthalidone, diphenhydramine, knee brace adjustable hinged, epinephrine, and famotidine. Also is allergic to salicylates and fioricet [butalbital-apap-caffeine].  Gordon Stark  has a past medical history of Allergy; Dysrhythmia; and Hypertension. Also  has a past surgical history that includes Lithotripsy and Cystoscopy.  Objective:   Vitals: BP 128/90 (BP Location: Right Arm, Patient Position: Sitting, Cuff Size: Large)   Pulse 82   Temp 98.7 F (37.1 C) (Oral)   Resp 18   Ht 5' 10.5"  (1.791 m)   Wt 266 lb 12.8 oz (121 kg)   SpO2 98%   BMI 37.74 kg/m   Physical Exam  Constitutional: He is oriented to person, place, and time. He appears well-developed and well-nourished.  HENT:  Head: Normocephalic and atraumatic.  Right Ear: Tympanic membrane, external ear and ear canal normal.  Left Ear: External ear and ear canal normal. Tympanic membrane is erythematous and bulging.  Nose: Mucosal edema ( more prominent on left) and rhinorrhea present. Right sinus exhibits no maxillary sinus tenderness and no frontal sinus tenderness. Left sinus exhibits no maxillary sinus tenderness and no frontal sinus tenderness.  Mouth/Throat: Uvula is midline and mucous membranes are normal. Posterior oropharyngeal erythema present.  Eyes: Conjunctivae are normal.  Neck: Normal range of motion.  Cardiovascular: Normal rate, regular rhythm and normal heart sounds.   Pulmonary/Chest: Effort normal and breath sounds normal.  Lymphadenopathy:       Head (right side): No submental, no submandibular, no tonsillar, no preauricular, no posterior auricular and no occipital adenopathy present.       Head (left side): No submental, no submandibular, no tonsillar, no preauricular, no posterior auricular and no occipital adenopathy present.    He has no cervical adenopathy.       Right: No supraclavicular adenopathy present.       Left: No supraclavicular adenopathy present.  Neurological: He is alert and oriented to person, place, and time.  Skin: Skin is warm and dry.  Psychiatric: He has a normal mood and affect.  Vitals reviewed.     Results for orders placed or performed in visit on 08/03/16 (from the  past 24 hour(s))  POCT CBC     Status: Abnormal   Collection Time: 08/03/16  2:57 PM  Result Value Ref Range   WBC 8.7 4.6 - 10.2 K/uL   Lymph, poc 2.0 0.6 - 3.4   POC LYMPH PERCENT 23.4 10 - 50 %L   MID (cbc) 1.0 (A) 0 - 0.9   POC MID % 11.1 0 - 12 %M   POC Granulocyte 5.7 2 - 6.9    Granulocyte percent 65.5 37 - 80 %G   RBC 4.81 4.69 - 6.13 M/uL   Hemoglobin 15.7 14.1 - 18.1 g/dL   HCT, POC 40.943.7 81.143.5 - 53.7 %   MCV 90.7 80 - 97 fL   MCH, POC 32.6 (A) 27 - 31.2 pg   MCHC 35.9 (A) 31.8 - 35.4 g/dL   RDW, POC 91.413.2 %   Platelet Count, POC 196 142 - 424 K/uL   MPV 6.8 0 - 99.8 fL    No results found for this or any previous visit (from the past 24 hour(s)).  Assessment and Plan :  1. Cough -Likely viral, will treat supportively - POCT CBC - chlorpheniramine-HYDROcodone (TUSSIONEX PENNKINETIC ER) 10-8 MG/5ML SUER; Take 5 mLs by mouth every 12 (twelve) hours as needed for cough.  Dispense: 100 mL; Refill: 0  2. Essential hypertension - COMPLETE METABOLIC PANEL WITH GFR - chlorthalidone (HYGROTON) 25 MG tablet; TAKE 1 TABLET (25 MG TOTAL) BY MOUTH DAILY.  Dispense: 90 tablet; Refill: 0 -Pt to follow up within 4 weeks for complete physical   3. Acute otitis media, unspecified otitis media type - amoxicillin (AMOXIL) 500 MG capsule; Take 1 capsule (500 mg total) by mouth 2 (two) times daily.  Dispense: 14 capsule; Refill: 0  4. Nasal congestion - fluticasone (FLONASE) 50 MCG/ACT nasal spray; Place 2 sprays into both nostrils daily.  Dispense: 16 g; Refill: 0  -Return to clinic if symptoms worsen, do not improve, or as needed   Benjiman CoreBrittany Analia Zuk, PA-C  [facility] 08/03/2016 2:27 PM

## 2016-08-03 NOTE — Patient Instructions (Addendum)
   Take antibiotics as prescribed. Use flonase daily. Buy OTC mucinex to help with congestion.  Cough syrup at night for cough.  -Return to clinic if symptoms worsen, do not improve in 10 days, or as needed   IF you received an x-ray today, you will receive an invoice from [facility]. Please contact [facility] at [phone] with questions or concerns regarding your invoice.   IF you received labwork today, you will receive an invoice from [facility]. Please contact [facility] at [phone] with questions or concerns regarding your invoice.   Our billing staff will not be able to assist you with questions regarding bills from these companies.  You will be contacted with the lab results as soon as they are available. The fastest way to get your results is to activate your My Chart account. Instructions are located on the last page of this paperwork. If you have not heard from us regarding the results in 2 weeks, please contact this office.      Otitis Media, Adult Otitis media is redness, soreness, and puffiness (swelling) in the space just behind your eardrum (middle ear). It may be caused by allergies or infection. It often happens along with a cold. HOME CARE  Take your medicine as told. Finish it even if you start to feel better.  Only take over-the-counter or prescription medicines for pain, discomfort, or fever as told by your doctor.  Follow up with your doctor as told. GET HELP IF:  You have otitis media only in one ear, or bleeding from your nose, or both.  You notice a lump on your neck.  You are not getting better in 3-5 days.  You feel worse instead of better. GET HELP RIGHT AWAY IF:   You have pain that is not helped with medicine.  You have puffiness, redness, or pain around your ear.  You get a stiff neck.  You cannot move part of your face (paralysis).  You notice that the bone behind your ear hurts when you  touch it. MAKE SURE YOU:   Understand these instructions.  Will watch your condition.  Will get help right away if you are not doing well or get worse.   This information is not intended to replace advice given to you by your health care provider. Make sure you discuss any questions you have with your health care provider.   Document Released: 04/02/2008 Document Revised: 11/05/2014 Document Reviewed: 05/12/2013 Elsevier Interactive Patient Education [employer]2016 Elsevier Inc.

## 2016-08-06 ENCOUNTER — Telehealth: Payer: Self-pay

## 2016-12-25 NOTE — Telephone Encounter (Signed)
close

## 2017-01-23 ENCOUNTER — Ambulatory Visit (INDEPENDENT_AMBULATORY_CARE_PROVIDER_SITE_OTHER): Payer: PRIVATE HEALTH INSURANCE | Admitting: Physician Assistant

## 2017-01-23 VITALS — BP 132/83 | HR 71 | Temp 98.5°F | Resp 18 | Ht 70.0 in | Wt 272.6 lb

## 2017-01-23 DIAGNOSIS — Z889 Allergy status to unspecified drugs, medicaments and biological substances status: Secondary | ICD-10-CM

## 2017-01-23 DIAGNOSIS — Z0000 Encounter for general adult medical examination without abnormal findings: Secondary | ICD-10-CM

## 2017-01-23 DIAGNOSIS — Z130 Encounter for screening for diseases of the blood and blood-forming organs and certain disorders involving the immune mechanism: Secondary | ICD-10-CM | POA: Diagnosis not present

## 2017-01-23 DIAGNOSIS — Z1211 Encounter for screening for malignant neoplasm of colon: Secondary | ICD-10-CM

## 2017-01-23 DIAGNOSIS — Z1329 Encounter for screening for other suspected endocrine disorder: Secondary | ICD-10-CM | POA: Diagnosis not present

## 2017-01-23 DIAGNOSIS — N481 Balanitis: Secondary | ICD-10-CM | POA: Diagnosis not present

## 2017-01-23 DIAGNOSIS — R8299 Other abnormal findings in urine: Secondary | ICD-10-CM | POA: Diagnosis not present

## 2017-01-23 DIAGNOSIS — I10 Essential (primary) hypertension: Secondary | ICD-10-CM

## 2017-01-23 DIAGNOSIS — Z1389 Encounter for screening for other disorder: Secondary | ICD-10-CM | POA: Diagnosis not present

## 2017-01-23 DIAGNOSIS — M1990 Unspecified osteoarthritis, unspecified site: Secondary | ICD-10-CM

## 2017-01-23 DIAGNOSIS — R82998 Other abnormal findings in urine: Secondary | ICD-10-CM

## 2017-01-23 DIAGNOSIS — Z13220 Encounter for screening for lipoid disorders: Secondary | ICD-10-CM | POA: Diagnosis not present

## 2017-01-23 LAB — POCT URINALYSIS DIP (MANUAL ENTRY)
BILIRUBIN UA: NEGATIVE
Blood, UA: NEGATIVE
Glucose, UA: NEGATIVE
Ketones, POC UA: NEGATIVE
NITRITE UA: NEGATIVE
PH UA: 6.5 (ref 5.0–8.0)
PROTEIN UA: NEGATIVE
Spec Grav, UA: 1.02 (ref 1.030–1.035)
UROBILINOGEN UA: 0.2 (ref ?–2.0)

## 2017-01-23 LAB — POC MICROSCOPIC URINALYSIS (UMFC): Mucus: ABSENT

## 2017-01-23 MED ORDER — CHLORTHALIDONE 25 MG PO TABS: ORAL_TABLET | ORAL | 1 refills | Status: DC

## 2017-01-23 MED ORDER — EPINEPHRINE 0.3 MG/0.3ML IJ SOAJ: 0.3000 mg | Freq: Once | INTRAMUSCULAR | 0 refills | Status: AC

## 2017-01-23 MED ORDER — CLOTRIMAZOLE 1 % EX CREA: 1.0000 "application " | TOPICAL_CREAM | Freq: Two times a day (BID) | CUTANEOUS | 0 refills | Status: DC

## 2017-01-23 NOTE — Progress Notes (Signed)
Gordon Stark  MRN: [id number] DOB: 1966/06/15  Subjective:  Pt is a 51 y.o. male who presents for annual physical exam. Pt is fasting today.   Social: Works third shift at [employer]. He is married. He has a 50 year old and 50 year old. [place]   Diet: Over the last year he has tried to incorporate more salads but he is more a burger, fries, steaks, and pizza kind of guy. He drinks mostly water at work. Will drink 7 up and a glass of milk a day.   Exercise: Very little except when he chases the kids.   Sleep: Roughly ~6-8 hours throughout the day since he works nights.   Bowel movements:Has one twice daily, normal for him.   He denies nocturia, weak urinary stream, and ED.   Last dental exam: 2017 Last vision exam: 2017  Vaccinations      Tetanus: 2013      Chronic conditions: 1)HTN: Has had a dx for >10 years. Controlled on chlorthalidone 61m. Checks bp regularly. Systolic range in 1754-492E Denies any side effects.   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 His current Epi pen is expired and he needs a refill. He has never had to use it.   3) Arthritis: Has left knee and left ankle arthritis. Pain controlled with knee brace and tylenol. Has been followed closely by ortho for this. Requests a new brace as he wears it daily and notes the one he has right now is >151year old.   Acute issues: He has had penile itching for the past 2 weeks. Has associated burning and whitish discharge at penile entrance. Denies pain, dysuria, urinary frequency, rectal pain, and testicular pain. Notes his wife had a UTI and then a yeast infection a couple of weeks ago. She is being treated for yeast now. He noticed the itching after she was treated for her UTI.   Patient Active Problem List   Diagnosis Date Noted  . Arthritis of knee, left 09/01/2014  . HTN (hypertension) 12/07/2012    Current Outpatient Prescriptions on File Prior to Visit    Medication Sig Dispense Refill  . chlorthalidone (HYGROTON) 25 MG tablet TAKE 1 TABLET (25 MG TOTAL) BY MOUTH DAILY. 90 tablet 0  . diphenhydrAMINE (BENADRYL) 25 MG tablet Take 25 mg by mouth 2 (two) times daily.    .Water engineerBandages & Supports (KNEE BRACE ADJUSTABLE HINGED) MISC 1 each by Does not apply route every morning. 1 each 1  . EPINEPHrine (EPIPEN 2-PAK) 0.3 mg/0.3 mL IJ SOAJ injection Inject 0.3 mLs (0.3 mg total) into the muscle once. 1 Device 0  . famotidine (PEPCID) 20 MG tablet Take 20 mg by mouth 2 (two) times daily.    . chlorpheniramine-HYDROcodone (TUSSIONEX PENNKINETIC ER) 10-8 MG/5ML SUER Take 5 mLs by mouth every 12 (twelve) hours as needed for cough. (Patient not taking: Reported on 01/23/2017) 100 mL 0  . fluticasone (FLONASE) 50 MCG/ACT nasal spray Place 2 sprays into both nostrils daily. (Patient not taking: Reported on 01/23/2017) 16 g 0   No current facility-administered medications on file prior to visit.     Allergies  Allergen Reactions  . Salicylates Other (See Comments)    Lips swell so bad they can bust  . Fioricet [Butalbital-Apap-Caffeine] Palpitations    Social History   Social History  . Marital status: Married    Spouse name: N/A  . Number of  children: N/A  . Years of education: N/A   Social History Main Topics  . Smoking status: Former Research scientist (life sciences)  . Smokeless tobacco: Current User    Types: Chew  . Alcohol use 0.0 oz/week     Comment: 12 pack beer a week  . Drug use: No  . Sexual activity: Not Asked   Other Topics Concern  . None   Social History Narrative  . None    Past Surgical History:  Procedure Laterality Date  . CYSTOSCOPY     6 times  . LITHOTRIPSY     75yr ago    Family History  Problem Relation Age of Onset  . Cancer Father   . Hypertension Father   . Cancer Maternal Grandmother   . Diabetes Paternal Grandfather     Review of Systems  Constitutional: Negative for activity change, appetite change, chills,  diaphoresis, fatigue, fever and unexpected weight change.  HENT: Negative for congestion, dental problem, drooling, ear discharge, ear pain, facial swelling, hearing loss, mouth sores, nosebleeds, postnasal drip, rhinorrhea, sinus pain, sinus pressure, sneezing, sore throat, tinnitus, trouble swallowing and voice change.   Eyes: Negative for photophobia, pain, discharge, redness, itching and visual disturbance.  Respiratory: Negative for apnea, cough, choking, chest tightness, shortness of breath, wheezing and stridor.   Cardiovascular: Positive for palpitations (has had this for years if he drinks caffeine, has been evaluated by a cardiologist). Negative for chest pain and leg swelling.  Gastrointestinal: Negative for abdominal distention, abdominal pain, anal bleeding, blood in stool, constipation, diarrhea, nausea, rectal pain and vomiting.  Endocrine: Negative for cold intolerance, heat intolerance, polydipsia, polyphagia and polyuria.  Genitourinary: Negative for decreased urine volume, difficulty urinating, discharge, dysuria, enuresis, flank pain, frequency, genital sores, hematuria, penile pain, penile swelling, scrotal swelling, testicular pain and urgency.  Musculoskeletal: Positive for arthralgias. Negative for back pain, gait problem, joint swelling, myalgias, neck pain and neck stiffness.  Skin: Negative for color change, pallor, rash and wound.  Allergic/Immunologic: Negative for environmental allergies, food allergies and immunocompromised state.  Neurological: Negative for dizziness, tremors, seizures, syncope, facial asymmetry, speech difficulty, weakness, light-headedness, numbness and headaches.  Hematological: Negative for adenopathy. Does not bruise/bleed easily.  Psychiatric/Behavioral: Negative for agitation, behavioral problems, confusion, decreased concentration, dysphoric mood, hallucinations, self-injury, sleep disturbance and suicidal ideas. The patient is not nervous/anxious  and is not hyperactive.     Objective:  BP 132/83   Pulse 71   Temp 98.5 F (36.9 C) (Oral)   Resp 18   Ht '5\' 10"'  (1.778 m)   Wt 272 lb 9.6 oz (123.7 kg)   SpO2 96%   BMI 39.11 kg/m   Physical Exam  Constitutional: He is oriented to person, place, and time and well-developed, well-nourished, and in no distress.  HENT:  Head: Normocephalic and atraumatic.  Right Ear: Hearing, external ear and ear canal normal. Tympanic membrane is retracted.  Left Ear: Hearing, external ear and ear canal normal. Tympanic membrane is retracted.  Nose: Mucosal edema (mild bilaterally) present.  Mouth/Throat: Uvula is midline, oropharynx is clear and moist and mucous membranes are normal. No oropharyngeal exudate.  Eyes: Conjunctivae and EOM are normal. Pupils are equal, round, and reactive to light.  Neck: Trachea normal and normal range of motion.  Cardiovascular: Normal rate, regular rhythm, normal heart sounds and intact distal pulses.   Pulmonary/Chest: Effort normal and breath sounds normal.  Abdominal: Soft. Normal appearance and bowel sounds are normal.  Genitourinary: No discharge found.  Genitourinary Comments: Small papules  with blotchy erythema and glazed appearance affecting small area of left sided glans.   Musculoskeletal: Normal range of motion.       Left knee: Tenderness found. Medial joint line tenderness noted.  Lymphadenopathy:       Head (right side): No submental, no submandibular, no tonsillar, no preauricular, no posterior auricular and no occipital adenopathy present.       Head (left side): No submental, no submandibular, no tonsillar, no preauricular, no posterior auricular and no occipital adenopathy present.    He has no cervical adenopathy.       Right: No supraclavicular adenopathy present.       Left: No supraclavicular adenopathy present.  Neurological: He is alert and oriented to person, place, and time. He has normal sensation, normal strength and normal reflexes.  Gait normal.  Skin: Skin is warm and dry.  Psychiatric: Affect normal.  Vitals reviewed.   Visual Acuity Screening   Right eye Left eye Both eyes  Without correction:     With correction: '20/13 20/20 20/15 '   Results for orders placed or performed in visit on 01/23/17 (from the past 24 hour(s))  POCT urinalysis dipstick     Status: Abnormal   Collection Time: 01/23/17 12:33 PM  Result Value Ref Range   Color, UA yellow yellow   Clarity, UA clear clear   Glucose, UA negative negative   Bilirubin, UA negative negative   Ketones, POC UA negative negative   Spec Grav, UA 1.020 1.030 - 1.035   Blood, UA negative negative   pH, UA 6.5 5.0 - 8.0   Protein Ur, POC negative negative   Urobilinogen, UA 0.2 Negative - 2.0   Nitrite, UA Negative Negative   Leukocytes, UA small (1+) (A) Negative  POCT Microscopic Urinalysis ([facility])     Status: Abnormal   Collection Time: 01/23/17 12:41 PM  Result Value Ref Range   WBC,UR,HPF,POC Many (A) None WBC/hpf   RBC,UR,HPF,POC None None RBC/hpf   Bacteria None None, Too numerous to count   Mucus Absent Absent   Epithelial Cells, UR Per Microscopy Few (A) None, Too numerous to count cells/hpf    Assessment and Plan :  Discussed healthy lifestyle, diet, exercise, preventative care, vaccinations, and addressed patient's concerns. Plan for follow up in 6 months. Otherwise, plan for specific conditions below.  1. Annual physical exam Await lab results.   2. Screen for colon cancer - Ambulatory referral to Gastroenterology  3. Screening, lipid - Lipid panel  4. Screening, anemia, deficiency, iron - CBC with Differential/Platelet  5. Screening for thyroid disorder - TSH  6. Screening for hematuria or proteinuria - POCT Microscopic Urinalysis ([facility]) - POCT urinalysis dipstick  7. Hx of allergic reaction - EPINEPHrine (EPIPEN 2-PAK) 0.3 mg/0.3 mL IJ SOAJ injection; Inject 0.3 mLs (0.3 mg total) into the muscle once.  Dispense: 1 Device;  Refill: 0  8. Essential hypertension Well controlled in office. Return in 6 months for reevaluation. If everything controlled at this visit, will consider one year worth of refills.  - CMP14+EGFR - chlorthalidone (HYGROTON) 25 MG tablet; TAKE 1 TABLET (25 MG TOTAL) BY MOUTH DAILY.  Dispense: 90 tablet; Refill: 1  9. Arthritis Given a new knee brace in office.   10. Balanitis Instructed to use cream BID for 10 days. If no improvement after 7 days, contact our office as he may warrant Rx for oral diflucan 128m at that time. Informed to refrain from sexual activity until he and his wife are  both symptom free.  DECLINES STD testing today.  - clotrimazole (LOTRIMIN) 1 % cream; Apply 1 application topically 2 (two) times daily.  Dispense: 30 g; Refill: 0  11. Leukocytes in urine - Urine culture  Tenna Delaine PA-C  [facility] 01/23/2017 11:57 AM

## 2017-01-23 NOTE — Patient Instructions (Addendum)
For hypertension, continue taking medication daily and checking your bp outside of the office. Your goal is <140/90. Follow up in 6 months for repeat bp. If everything is controlled at that time we will do yearly refills.   For penile itching, use cream to affected area twice a day for the next 7-10 days. If you are still having symptoms in 7 days, call our office and I can call in an oral medication. Avoid sexual activity until you and your wife's symptoms have completely resolved.  For allergies, make sure you are using flonase daily to help with nasal congestion.  For overall health, try to add vegetables when you can!   Thank you for letting me participate in your health and well being.   Heart-Healthy Eating Plan Heart-healthy meal planning includes:  Limiting unhealthy fats.  Increasing healthy fats.  Making other small dietary changes. You may need to talk with your doctor or a diet specialist (dietitian) to create an eating plan that is right for you. What types of fat should I choose?  Choose healthy fats. These include olive oil and canola oil, flaxseeds, walnuts, almonds, and seeds.  Eat more omega-3 fats. These include salmon, mackerel, sardines, tuna, flaxseed oil, and ground flaxseeds. Try to eat fish at least twice each week.  Limit saturated fats.  Saturated fats are often found in animal products, such as meats, butter, and cream.  Plant sources of saturated fats include palm oil, palm kernel oil, and coconut oil.  Avoid foods with partially hydrogenated oils in them. These include stick margarine, some tub margarines, cookies, crackers, and other baked goods. These contain trans fats. What general guidelines do I need to follow?  Check food labels carefully. Identify foods with trans fats or high amounts of saturated fat.  Fill one half of your plate with vegetables and green salads. Eat 4-5 servings of vegetables per day. A serving of vegetables is:  1 cup of  raw leafy vegetables.   cup of raw or cooked cut-up vegetables.   cup of vegetable juice.  Fill one fourth of your plate with whole grains. Look for the word "whole" as the first word in the ingredient list.  Fill one fourth of your plate with lean protein foods.  Eat 4-5 servings of fruit per day. A serving of fruit is:  One medium whole fruit.   cup of dried fruit.   cup of fresh, frozen, or canned fruit.   cup of 100% fruit juice.  Eat more foods that contain soluble fiber. These include apples, broccoli, carrots, beans, peas, and barley. Try to get 20-30 g of fiber per day.  Eat more home-cooked food. Eat less restaurant, buffet, and fast food.  Limit or avoid alcohol.  Limit foods high in starch and sugar.  Avoid fried foods.  Avoid frying your food. Try baking, boiling, grilling, or broiling it instead. You can also reduce fat by:  Removing the skin from poultry.  Removing all visible fats from meats.  Skimming the fat off of stews, soups, and gravies before serving them.  Steaming vegetables in water or broth.  Lose weight if you are overweight.  Eat 4-5 servings of nuts, legumes, and seeds per week:  One serving of dried beans or legumes equals  cup after being cooked.  One serving of nuts equals 1 ounces.  One serving of seeds equals  ounce or one tablespoon.  You may need to keep track of how much salt or sodium you eat.  This is especially true if you have high blood pressure. Talk with your doctor or dietitian to get more information. What foods can I eat? Grains  Breads, including [place], white, pita, wheat, raisin, rye, oatmeal, and [place]. Tortillas that are neither fried nor made with lard or trans fat. Low-fat rolls, including hotdog and hamburger buns and English muffins. Biscuits. Muffins. Waffles. Pancakes. Light popcorn. Whole-grain cereals. Flatbread. Melba toast. Pretzels. Breadsticks. Rusks. Low-fat snacks. Low-fat crackers,  including oyster, saltine, matzo, graham, animal, and rye. Rice and pasta, including brown rice and pastas that are made with whole wheat. Vegetables  All vegetables. Fruits  All fruits, but limit coconut. Meats and Other Protein Sources  Lean, well-trimmed beef, veal, pork, and lamb. Chicken and [place] without skin. All fish and shellfish. Wild duck, rabbit, pheasant, and venison. Egg whites or low-cholesterol egg substitutes. Dried beans, peas, lentils, and tofu. Seeds and most nuts. Dairy  Low-fat or nonfat cheeses, including ricotta, string, and mozzarella. Skim or 1% milk that is liquid, powdered, or evaporated. Buttermilk that is made with low-fat milk. Nonfat or low-fat yogurt. Beverages  Mineral water. Diet carbonated beverages. Sweets and Desserts  Sherbets and fruit ices. Honey, jam, marmalade, jelly, and syrups. Meringues and gelatins. Pure sugar candy, such as hard candy, jelly beans, gumdrops, mints, marshmallows, and small amounts of dark chocolate. [employer] Eat all sweets and desserts in moderation. Fats and Oils  Nonhydrogenated (trans-free) margarines. Vegetable oils, including soybean, sesame, sunflower, olive, peanut, safflower, corn, canola, and cottonseed. Salad dressings or mayonnaise made with a vegetable oil. Limit added fats and oils that you use for cooking, baking, salads, and as spreads. Other  Cocoa powder. Coffee and tea. All seasonings and condiments. The items listed above may not be a complete list of recommended foods or beverages. Contact your dietitian for more options.  What foods are not recommended? Grains  Breads that are made with saturated or trans fats, oils, or whole milk. Croissants. Butter rolls. Cheese breads. Sweet rolls. Donuts. Buttered popcorn. Chow mein noodles. High-fat crackers, such as cheese or butter crackers. Meats and Other Protein Sources  Fatty meats, such as hotdogs, short ribs, sausage, spareribs, bacon, rib eye roast or  steak, and mutton. High-fat deli meats, such as salami and bologna. Caviar. Domestic duck and goose. Organ meats, such as kidney, liver, sweetbreads, and heart. Dairy  Cream, sour cream, cream cheese, and creamed cottage cheese. Whole-milk cheeses, including blue (bleu), Monterey Jack, [place], [place], American, [place], Swiss, cheddar, [place], and [place]. Whole or 2% milk that is liquid, evaporated, or condensed. Whole buttermilk. Cream sauce or high-fat cheese sauce. Yogurt that is made from whole milk. Beverages  Regular sodas and juice drinks with added sugar. Sweets and Desserts  Frosting. Pudding. Cookies. Cakes other than angel food cake. Candy that has milk chocolate or white chocolate, hydrogenated fat, butter, coconut, or unknown ingredients. Buttered syrups. Full-fat ice cream or ice cream drinks. Fats and Oils  Gravy that has suet, meat fat, or shortening. Cocoa butter, hydrogenated oils, palm oil, coconut oil, palm kernel oil. These can often be found in baked products, candy, fried foods, nondairy creamers, and whipped toppings. Solid fats and shortenings, including bacon fat, salt pork, lard, and butter. Nondairy cream substitutes, such as coffee creamers and sour cream substitutes. Salad dressings that are made of unknown oils, cheese, or sour cream. The items listed above may not be a complete list of foods and beverages to avoid. Contact your dietitian for more information.  This information is not intended to replace advice given to you by your health care provider. Make sure you discuss any questions you have with your health care provider. Document Released: 04/15/2012 Document Revised: 03/22/2016 Document Reviewed: 04/08/2014 Elsevier Interactive Patient Education  2017 Elsevier Inc.  Genital Yeast Infection, Male In men, a genital yeast infection is a condition that causes soreness, swelling, and redness (inflammation) of the head of the penis (glans penis). This type of  infection is also called balanitis. A genital yeast infection can be spread through sexual contact, but it can also develop without sexual contact. If the infection is not treated properly, it is likely to come back. What are the causes? This condition is caused by a change in the normal balance of the yeast and bacteria that live on the skin. This change causes an overgrowth of yeast, which causes the inflammation. Many types of yeast can cause this infection, but candida is the most common. What increases the risk? This condition is more likely to develop in a man if:  He takes antibiotic medicines.  He has diabetes.  He is exposed to the infection by a sexual partner.  He is not circumcised.  He has a weak defense (immune) system.  He has been taking steroid medicines for a long time.  He has poor hygiene. What are the signs or symptoms? Symptoms of this condition include:  Itching.  Dry, red, or cracked skin on the penis.  Swelling.  Pain while urinating or difficulty urinating.  Thick, bad-smelling discharge on the penis. How is this diagnosed? This condition is diagnosed with a medical history and physical exam. Your health care provider may examine a sample of any discharge from the penis and send the sample for testing. You may also have tests, including:  A urine test to rule out a urinary tract infection (UTI).  A blood test to rule out diabetes. How is this treated? This condition is treated with antifungal cream or pills along with self-care at home. Antifungal medicines may be prescribed to you or they may be available over-the-counter. For men who are not circumcised, circumcision may be recommended to control infections that return and are difficult to treat. Follow these instructions at home:  Take or apply over-the-counter and prescription medicines only as told by your health care provider.  Do not have sex until your health care provider has approved.  Tell your sexual partner that you have a yeast infection. That person should go to his or her health care provider if he or she develops symptoms.  Eat more yogurt. This may help to keep your yeast infection from returning.  Wash your penis with soap and water every day. If you are not circumcised, pull back the foreskin to wash. Make sure to dry your penis completely after washing.  Wear breathable, cotton underwear.  Keep your underwear clean and dry.  If you have diabetes, keep your blood sugar levels under strict control. Contact a health care provider if:  You have a fever.  Your symptoms go away and then return.  Your symptoms do not get better with treatment.  Your symptoms get worse.  You have new symptoms. Get help right away if:  Your swelling and inflammation become so severe that you cannot urinate. This information is not intended to replace advice given to you by your health care provider. Make sure you discuss any questions you have with your health care provider. Document Released: 11/22/2004 Document Revised: 03/22/2016 Document  Reviewed: 04/18/2015 Elsevier Interactive Patient Education  2017 [employer]  IF you received an x-ray today, you will receive an invoice from [facility]. Please contact [facility] at [phone] with questions or concerns regarding your invoice.   IF you received labwork today, you will receive an invoice from [facility]. Please contact [facility] at [phone] with questions or concerns regarding your invoice.   Our billing staff will not be able to assist you with questions regarding bills from these companies.  You will be contacted with the lab results as soon as they are available. The fastest way to get your results is to activate your My Chart account. Instructions are located on the last page of this paperwork. If you have not heard from us regarding the results in 2 weeks, please contact this office.

## 2017-01-24 LAB — CMP14+EGFR
ALK PHOS: 55 IU/L (ref 39–117)
ALT: 35 IU/L (ref 0–44)
AST: 22 IU/L (ref 0–40)
Albumin/Globulin Ratio: 1.5 (ref 1.2–2.2)
Albumin: 4.2 g/dL (ref 3.5–5.5)
BUN/Creatinine Ratio: 17 (ref 9–20)
BUN: 16 mg/dL (ref 6–24)
Bilirubin Total: 0.7 mg/dL (ref 0.0–1.2)
CALCIUM: 9.5 mg/dL (ref 8.7–10.2)
CO2: 26 mmol/L (ref 18–29)
CREATININE: 0.94 mg/dL (ref 0.76–1.27)
Chloride: 99 mmol/L (ref 96–106)
GFR calc Af Amer: 109 mL/min/{1.73_m2} (ref >59)
GFR, EST NON AFRICAN AMERICAN: 94 mL/min/{1.73_m2} (ref >59)
GLOBULIN, TOTAL: 2.8 g/dL (ref 1.5–4.5)
GLUCOSE: 91 mg/dL (ref 65–99)
Potassium: 3.4 mmol/L — ABNORMAL LOW (ref 3.5–5.2)
Sodium: 141 mmol/L (ref 134–144)
Total Protein: 7 g/dL (ref 6.0–8.5)

## 2017-01-24 LAB — CBC WITH DIFFERENTIAL/PLATELET
BASOS ABS: 0.1 10*3/uL (ref 0.0–0.2)
Basos: 1 %
EOS (ABSOLUTE): 0.1 10*3/uL (ref 0.0–0.4)
EOS: 2 %
HEMATOCRIT: 44.2 % (ref 37.5–51.0)
HEMOGLOBIN: 14.8 g/dL (ref 13.0–17.7)
IMMATURE GRANULOCYTES: 0 %
Immature Grans (Abs): 0 10*3/uL (ref 0.0–0.1)
LYMPHS ABS: 2.2 10*3/uL (ref 0.7–3.1)
Lymphs: 31 %
MCH: 30.6 pg (ref 26.6–33.0)
MCHC: 33.5 g/dL (ref 31.5–35.7)
MCV: 91 fL (ref 79–97)
MONOCYTES: 9 %
MONOS ABS: 0.7 10*3/uL (ref 0.1–0.9)
NEUTROS PCT: 57 %
Neutrophils Absolute: 4.1 10*3/uL (ref 1.4–7.0)
Platelets: 183 10*3/uL (ref 150–379)
RBC: 4.84 x10E6/uL (ref 4.14–5.80)
RDW: 13.8 % (ref 12.3–15.4)
WBC: 7.1 10*3/uL (ref 3.4–10.8)

## 2017-01-24 LAB — LIPID PANEL
Chol/HDL Ratio: 2.1 ratio (ref 0.0–5.0)
Cholesterol, Total: 132 mg/dL (ref 100–199)
HDL: 64 mg/dL
LDL Calculated: 51 mg/dL (ref 0–99)
Triglycerides: 85 mg/dL (ref 0–149)
VLDL Cholesterol Cal: 17 mg/dL (ref 5–40)

## 2017-01-24 LAB — TSH: TSH: 2.04 u[IU]/mL (ref 0.450–4.500)

## 2017-01-25 LAB — URINE CULTURE

## 2017-02-26 ENCOUNTER — Encounter: Payer: Self-pay | Admitting: Physician Assistant

## 2017-04-25 ENCOUNTER — Telehealth: Payer: Self-pay | Admitting: Physician Assistant

## 2017-04-25 NOTE — Telephone Encounter (Signed)
Called , and left message on AM, telling the patient  that he has refill for BP meds. for 6 months, and he can see that in AVS. I reminded him that he should follow up in 6 months for BP, and if is stable, is going to have refill for 1 year. Advised to call back if has questions.

## 2017-04-25 NOTE — Telephone Encounter (Signed)
Pt is calling stating that Barnett AbuWiseman was supposed to give him a years worth of BP meds, not three months and is currently running out of his medication.  Pt uses the [facility]. [phone]

## 2017-06-01 ENCOUNTER — Encounter: Payer: Self-pay | Admitting: Physician Assistant

## 2017-06-01 ENCOUNTER — Ambulatory Visit (INDEPENDENT_AMBULATORY_CARE_PROVIDER_SITE_OTHER): Payer: PRIVATE HEALTH INSURANCE | Admitting: Physician Assistant

## 2017-06-01 VITALS — BP 136/88 | HR 76 | Temp 98.9°F | Resp 18 | Ht 70.0 in | Wt 272.8 lb

## 2017-06-01 DIAGNOSIS — L84 Corns and callosities: Secondary | ICD-10-CM

## 2017-06-01 DIAGNOSIS — M1712 Unilateral primary osteoarthritis, left knee: Secondary | ICD-10-CM | POA: Diagnosis not present

## 2017-06-01 NOTE — Progress Notes (Signed)
Gordon GuthrieStephen T Stark  MRN: [id number] DOB: 1966-10-16  Subjective:  Gordon GuthrieStephen T Stark is a 51 y.o. male seen in office today for a chief complaint of right foot pain x 1 year. Has hard spot on the bottom of foot. Hurts worse when he is at work. Stands on a hard cement floor for 40+ hours a week. Has tried shaving it down, pedicures, and changing shoes. Notes it feels the best when he can shave it down. Denies redness, warmth, acute injury, numbness, tingling, and loss of sensation. No hx of diabetes or peripherial neuropathies.   Also needs FMLA paperwork filled out for left knee arthritis. He has dealt with this pain for a number of years. He used to go to orthopedics for cortisone shots. He will have recurrent flare ups ~once a month that resolve in 2-3 days with rest. He wears knee brace daily. Works as a Programme researcher, broadcasting/film/videomail processor and is on his feet on a hard cement floor for long periods of time at work.   Review of Systems Per HPI Patient Active Problem List   Diagnosis Date Noted  . Arthritis of knee, left 09/01/2014  . HTN (hypertension) 12/07/2012    Current Outpatient Prescriptions on File Prior to Visit  Medication Sig Dispense Refill  . chlorthalidone (HYGROTON) 25 MG tablet TAKE 1 TABLET (25 MG TOTAL) BY MOUTH DAILY. 90 tablet 1  . diphenhydrAMINE (BENADRYL) 25 MG tablet Take 25 mg by mouth 2 (two) times daily.    Clinical research associate. Elastic Bandages & Supports (KNEE BRACE ADJUSTABLE HINGED) MISC 1 each by Does not apply route every morning. 1 each 1  . famotidine (PEPCID) 20 MG tablet Take 20 mg by mouth 2 (two) times daily.    . clotrimazole (LOTRIMIN) 1 % cream Apply 1 application topically 2 (two) times daily. (Patient not taking: Reported on 06/01/2017) 30 g 0   No current facility-administered medications on file prior to visit.     Allergies  Allergen Reactions  . Salicylates Other (See Comments)    Lips swell so bad they can bust  . Fioricet [Butalbital-Apap-Caffeine] Palpitations     Objective:   BP 136/88 (BP Location: Right Arm, Patient Position: Sitting, Cuff Size: Large)   Pulse 76   Temp 98.9 F (37.2 C) (Oral)   Resp 18   Ht 5\' 10"  (1.778 m)   Wt 272 lb 12.8 oz (123.7 kg)   SpO2 97%   BMI 39.14 kg/m   Physical Exam  Constitutional: He is oriented to person, place, and time and well-developed, well-nourished, and in no distress.  HENT:  Head: Normocephalic and atraumatic.  Eyes: Conjunctivae are normal.  Neck: Normal range of motion.  Cardiovascular:  Pulses:      Dorsalis pedis pulses are 2+ on the right side, and 2+ on the left side.       Posterior tibial pulses are 2+ on the right side, and 2+ on the left side.  Pulmonary/Chest: Effort normal.  Musculoskeletal:       Right foot: There is normal range of motion, no swelling and normal capillary refill.  Neurological: He is alert and oriented to person, place, and time. Gait normal.  Skin: Skin is warm and dry.  A hyperkeratotic papule with a central core is present on the plantar surface near the base of the fourth toe of right foot. Surrounding callus noted. No erythema noted. Tenderness to palpation noted.    Psychiatric: Affect normal.  Vitals reviewed.    Procedure: Corn and  Callus Removal: Consent obtained from patient. Area prepped with alcohol pad. Double edged blade was used to gently shave away the top layer of hyperkeratotic skin. No bleeding occurred. Area was cleansed with alcohol pad. Pt tolerated well.   Pt noted much relief in his right foot after procedure.    Assessment and Plan :   1. Corn of foot Instructed to buy OTC corn pads to use. Wear good fitting shoes. Return in the future for corn/callus removal as needed.  2. Arthritis of left knee FMLA paperwork completed and returned to patient.    Benjiman CoreBrittany Antwann Preziosi, PA-C  Primary [facility] at [facility] 06/03/2017 9:50 AM

## 2017-06-01 NOTE — Patient Instructions (Addendum)
For corn, go buy OTC corn pads without salicylc acid in it and use daily while at work. Return if you need it shaved down in office again. Thank you for letting me participate in your health and well being.    Corns and Calluses Corns are small areas of thickened skin that occur on the top, sides, or tip of a toe. They contain a cone-shaped core with a point that can press on a nerve below. This causes pain. Calluses are areas of thickened skin that can occur anywhere on the body including hands, fingers, palms, soles of the feet, and heels.Calluses are usually larger than corns. What are the causes? Corns and calluses are caused by rubbing (friction) or pressure, such as from shoes that are too tight or do not fit properly. What increases the risk? Corns are more likely to develop in people who have toe deformities, such as hammer toes. Since calluses can occur with friction to any area of the skin, calluses are more likely to develop in people who:  Work with their hands.  Wear shoes that fit poorly, shoes that are too tight, or shoes that are high-heeled.  Have toes deformities.  What are the signs or symptoms? Symptoms of a corn or callus include:  A hard growth on the skin.  Pain or tenderness under the skin.  Redness and swelling.  Increased discomfort while wearing tight-fitting shoes.  How is this diagnosed? Corns and calluses may be diagnosed with a medical history and physical exam. How is this treated? Corns and calluses may be treated with:  Removing the cause of the friction or pressure. This may include: ? Changing your shoes. ? Wearing shoe inserts (orthotics) or other protective layers in your shoes, such as a corn pad. ? Wearing gloves.  Medicines to help soften skin in the hardened, thickened areas.  Reducing the size of the corn or callus by removing the dead layers of skin.  Antibiotic medicines to treat infection.  Surgery, if a toe deformity is the  cause.  Follow these instructions at home:  Take medicines only as directed by your health care provider.  If you were prescribed an antibiotic, finish all of it even if you start to feel better.  Wear shoes that fit well. Avoid wearing high-heeled shoes and shoes that are too tight or too loose.  Wear any padding, protective layers, gloves, or orthotics as directed by your health care provider.  Soak your hands or feet and then use a file or pumice stone to soften your corn or callus. Do this as directed by your health care provider.  Check your corn or callus every day for signs of infection. Watch for: ? Redness, swelling, or pain. ? Fluid, blood, or pus. Contact a health care provider if:  Your symptoms do not improve with treatment.  You have increased redness, swelling, or pain at the site of your corn or callus.  You have fluid, blood, or pus coming from your corn or callus.  You have new symptoms. This information is not intended to replace advice given to you by your health care provider. Make sure you discuss any questions you have with your health care provider. Document Released: 07/21/2004 Document Revised: 05/04/2016 Document Reviewed: 10/11/2014 Elsevier Interactive Patient Education  2018 [employer]    IF you received an x-ray today, you will receive an invoice from [facility]. Please contact [facility] at [phone] with questions or concerns regarding your invoice.   IF  you received labwork today, you will receive an invoice from [facility]. Please contact [facility] at [phone] with questions or concerns regarding your invoice.   Our billing staff will not be able to assist you with questions regarding bills from these companies.  You will be contacted with the lab results as soon as they are available. The fastest way to get your results is to activate your My Chart account. Instructions are located on the last page of this  paperwork. If you have not heard from us regarding the results in 2 weeks, please contact this office.

## 2017-07-31 ENCOUNTER — Ambulatory Visit: Payer: Self-pay | Admitting: Physician Assistant

## 2017-11-12 ENCOUNTER — Other Ambulatory Visit: Payer: Self-pay | Admitting: Physician Assistant

## 2017-11-12 DIAGNOSIS — I10 Essential (primary) hypertension: Secondary | ICD-10-CM

## 2017-11-12 NOTE — Telephone Encounter (Signed)
Last prescription written for 90tabs on 01/23/17 with 1 refill. Last OV addressing HTN 01/23/17.

## 2017-11-14 ENCOUNTER — Other Ambulatory Visit: Payer: Self-pay

## 2017-11-14 DIAGNOSIS — I10 Essential (primary) hypertension: Secondary | ICD-10-CM

## 2017-11-14 MED ORDER — CHLORTHALIDONE 25 MG PO TABS: ORAL_TABLET | ORAL | 0 refills | Status: DC

## 2017-12-04 ENCOUNTER — Ambulatory Visit: Payer: PRIVATE HEALTH INSURANCE | Admitting: Physician Assistant

## 2017-12-04 ENCOUNTER — Encounter: Payer: Self-pay | Admitting: Physician Assistant

## 2017-12-04 ENCOUNTER — Other Ambulatory Visit: Payer: Self-pay

## 2017-12-04 VITALS — BP 142/82 | HR 92 | Temp 99.9°F | Resp 18 | Ht 70.0 in | Wt 272.8 lb

## 2017-12-04 DIAGNOSIS — B9789 Other viral agents as the cause of diseases classified elsewhere: Secondary | ICD-10-CM | POA: Diagnosis not present

## 2017-12-04 DIAGNOSIS — R52 Pain, unspecified: Secondary | ICD-10-CM | POA: Diagnosis not present

## 2017-12-04 DIAGNOSIS — J988 Other specified respiratory disorders: Secondary | ICD-10-CM

## 2017-12-04 LAB — POCT INFLUENZA A/B
INFLUENZA A, POC: NEGATIVE
INFLUENZA B, POC: NEGATIVE

## 2017-12-04 MED ORDER — HYDROCOD POLST-CPM POLST ER 10-8 MG/5ML PO SUER: 5.0000 mL | Freq: Two times a day (BID) | ORAL | 0 refills | Status: DC | PRN

## 2017-12-04 MED ORDER — OSELTAMIVIR PHOSPHATE 75 MG PO CAPS: 75.0000 mg | ORAL_CAPSULE | Freq: Two times a day (BID) | ORAL | 0 refills | Status: DC

## 2017-12-04 NOTE — Progress Notes (Signed)
PRIMARY CARE AT [facility] [address] [place] [address] [phone]  Date:  12/04/2017   Name:  Gordon Stark   DOB:  Dec 17, 1965   MRN:  [id number]  PCP:  Magdalene River, PA-C    History of Present Illness:  Gordon Stark is a 52 y.o. male patient who presents to PCP with  Chief Complaint  Patient presents with  . Generalized Body Aches    X 1 day  . Chills    1 day- fever  . Cough    X today     Last night, he developed malaise, through fever cold chills.  He has bodyaches in hips shoulders.  Sore throat mildly.  Congested.  No post nasal drip that he knows.  Coughing clear sputum.  No sob or dyspnea.  He has taken tylenol only.   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   Patient Active Problem List   Diagnosis Date Noted  . Arthritis of knee, left 09/01/2014  . HTN (hypertension) 12/07/2012    Past Medical History:  Diagnosis Date  . Allergy   . Arthritis   . Chronic kidney disease   . Dysrhythmia   . Hypertension     Past Surgical History:  Procedure Laterality Date  . CYSTOSCOPY     6 times  . LITHOTRIPSY     23yrs ago    Social History   Tobacco Use  . Smoking status: Former Smoker    Types: Cigarettes    Last attempt to quit: 1992    Years since quitting: 27.1  . Smokeless tobacco: Current User    Types: Chew  Substance Use Topics  . Alcohol use: Yes    Alcohol/week: 0.0 oz    Comment: 12 pack beer a week  . Drug use: No    Family History  Problem Relation Age of Onset  . Cancer Father   . Hypertension Father   . Cancer Maternal Grandmother   . Diabetes Paternal Grandfather     Allergies  Allergen Reactions  . Salicylates Other (See Comments)    Lips swell so bad they can bust  . Fioricet [Butalbital-Apap-Caffeine] Palpitations    Medication list has been reviewed and updated.  Current Outpatient Medications on File Prior to Visit  Medication Sig Dispense Refill  . chlorthalidone (HYGROTON) 25 MG tablet TAKE 1 TABLET (25 MG  TOTAL) BY MOUTH DAILY. 30 tablet 0  . diphenhydrAMINE (BENADRYL) 25 MG tablet Take 25 mg by mouth 2 (two) times daily.    Clinical research associate Bandages & Supports (KNEE BRACE ADJUSTABLE HINGED) MISC 1 each by Does not apply route every morning. 1 each 1  . famotidine (PEPCID) 20 MG tablet Take 20 mg by mouth 2 (two) times daily.    . clotrimazole (LOTRIMIN) 1 % cream Apply 1 application topically 2 (two) times daily. (Patient not taking: Reported on 06/01/2017) 30 g 0   No current facility-administered medications on file prior to visit.     ROS ROS otherwise unremarkable unless listed above.  Physical Examination: BP (!) 142/82 (BP Location: Right Arm, Patient Position: Sitting, Cuff Size: Large)   Pulse 92   Temp 99.9 F (37.7 C) (Oral)   Resp 18   Ht 5\' 10"  (1.778 m)   Wt 272 lb 12.8 oz (123.7 kg)   SpO2 99%   BMI 39.14 kg/m  Ideal Body Weight: Weight in (lb) to have BMI = 25: 173.9  Physical Exam  Constitutional: He is oriented to person,  place, and time. He appears well-developed and well-nourished. No distress.  HENT:  Head: Atraumatic.  Right Ear: Tympanic membrane, external ear and ear canal normal.  Left Ear: Tympanic membrane, external ear and ear canal normal.  Nose: Mucosal edema and rhinorrhea present. Right sinus exhibits no maxillary sinus tenderness and no frontal sinus tenderness. Left sinus exhibits no maxillary sinus tenderness and no frontal sinus tenderness.  Mouth/Throat: No uvula swelling. No oropharyngeal exudate, posterior oropharyngeal edema or posterior oropharyngeal erythema.  Eyes: Conjunctivae, EOM and lids are normal. Pupils are equal, round, and reactive to light. Right eye exhibits normal extraocular motion. Left eye exhibits normal extraocular motion.  Neck: Trachea normal and full passive range of motion without pain. No edema and no erythema present.  Cardiovascular: Normal rate.  Pulmonary/Chest: Effort normal. No respiratory distress. He has no decreased  breath sounds. He has no wheezes. He has no rhonchi.  Neurological: He is alert and oriented to person, place, and time.  Skin: Skin is warm and dry. He is not diaphoretic.  Psychiatric: He has a normal mood and affect. His behavior is normal.   Results for orders placed or performed in visit on 12/04/17  POCT Influenza A/B  Result Value Ref Range   Influenza A, POC Negative Negative   Influenza B, POC Negative Negative     Assessment and Plan: Gordon Stark is a 52 y.o. male who is here today for cc of  Chief Complaint  Patient presents with  . Generalized Body Aches    X 1 day  . Chills    1 day- fever  . Cough    X today   Viral respiratory illness - Plan: oseltamivir (TAMIFLU) 75 MG capsule, chlorpheniramine-HYDROcodone (TUSSIONEX PENNKINETIC ER) 10-8 MG/5ML SUER  Generalized body aches - Plan: POCT Influenza A/B  Trena PlattStephanie Jhordan Mckibben, PA-C [facility] 2/19/20197:59 AM

## 2017-12-04 NOTE — Progress Notes (Deleted)
PRIMARY CARE AT [facility] [address] [place] [address] [phone]  Date:  12/04/2017   Name:  Gordon Stark   DOB:  Jan 20, 1966   MRN:  [id number]  PCP:  Magdalene River, PA-C    History of Present Illness:  Gordon Stark is a 52 y.o. male patient who presents to PCP with  Chief Complaint  Patient presents with  . Generalized Body Aches    X 1 day  . Chills    1 day- fever  . Cough    X today       Patient Active Problem List   Diagnosis Date Noted  . Arthritis of knee, left 09/01/2014  . HTN (hypertension) 12/07/2012    Past Medical History:  Diagnosis Date  . Allergy   . Arthritis   . Chronic kidney disease   . Dysrhythmia   . Hypertension     Past Surgical History:  Procedure Laterality Date  . CYSTOSCOPY     6 times  . LITHOTRIPSY     64yrs ago    Social History   Tobacco Use  . Smoking status: Former Smoker    Types: Cigarettes    Last attempt to quit: 1992    Years since quitting: 27.1  . Smokeless tobacco: Current User    Types: Chew  Substance Use Topics  . Alcohol use: Yes    Alcohol/week: 0.0 oz    Comment: 12 pack beer a week  . Drug use: No    Family History  Problem Relation Age of Onset  . Cancer Father   . Hypertension Father   . Cancer Maternal Grandmother   . Diabetes Paternal Grandfather     Allergies  Allergen Reactions  . Salicylates Other (See Comments)    Lips swell so bad they can bust  . Fioricet [Butalbital-Apap-Caffeine] Palpitations    Medication list has been reviewed and updated.  Current Outpatient Medications on File Prior to Visit  Medication Sig Dispense Refill  . chlorthalidone (HYGROTON) 25 MG tablet TAKE 1 TABLET (25 MG TOTAL) BY MOUTH DAILY. 30 tablet 0  . diphenhydrAMINE (BENADRYL) 25 MG tablet Take 25 mg by mouth 2 (two) times daily.    Clinical research associate Bandages & Supports (KNEE BRACE ADJUSTABLE HINGED) MISC 1 each by Does not apply route every morning. 1 each 1  . famotidine (PEPCID) 20  MG tablet Take 20 mg by mouth 2 (two) times daily.    . clotrimazole (LOTRIMIN) 1 % cream Apply 1 application topically 2 (two) times daily. (Patient not taking: Reported on 06/01/2017) 30 g 0   No current facility-administered medications on file prior to visit.     ROS ROS otherwise unremarkable unless listed above.  Physical Examination: BP (!) 142/82 (BP Location: Right Arm, Patient Position: Sitting, Cuff Size: Large)   Pulse 92   Temp 99.9 F (37.7 C) (Oral)   Resp 18   Ht 5\' 10"  (1.778 m)   Wt 272 lb 12.8 oz (123.7 kg)   SpO2 99%   BMI 39.14 kg/m  Ideal Body Weight: Weight in (lb) to have BMI = 25: 173.9  Physical Exam  Results for orders placed or performed in visit on 12/04/17  POCT Influenza A/B  Result Value Ref Range   Influenza A, POC Negative Negative   Influenza B, POC Negative Negative     Assessment and Plan: Gordon Stark is a 52 y.o. male who is here today  1. Generalized body aches *** -  POCT Influenza A/B   Trena PlattStephanie English, PA-C [facility] 12/04/2017 3:42 PM

## 2017-12-04 NOTE — Patient Instructions (Signed)
     IF you received an x-ray today, you will receive an invoice from [facility]. Please contact [facility] at [phone] with questions or concerns regarding your invoice.   IF you received labwork today, you will receive an invoice from [facility]. Please contact [facility] at [phone] with questions or concerns regarding your invoice.   Our billing staff will not be able to assist you with questions regarding bills from these companies.  You will be contacted with the lab results as soon as they are available. The fastest way to get your results is to activate your My Chart account. Instructions are located on the last page of this paperwork. If you have not heard from us regarding the results in 2 weeks, please contact this office.     

## 2017-12-17 ENCOUNTER — Encounter: Payer: Self-pay | Admitting: Physician Assistant

## 2017-12-19 ENCOUNTER — Other Ambulatory Visit: Payer: Self-pay | Admitting: Physician Assistant

## 2017-12-19 DIAGNOSIS — I10 Essential (primary) hypertension: Secondary | ICD-10-CM

## 2017-12-19 NOTE — Telephone Encounter (Signed)
Hygroton refill request Needs OV before refill.   Do not see a future appt.

## 2018-01-27 ENCOUNTER — Encounter: Payer: Self-pay | Admitting: Physician Assistant

## 2018-04-30 ENCOUNTER — Encounter: Payer: Self-pay | Admitting: Urgent Care

## 2018-04-30 ENCOUNTER — Ambulatory Visit (INDEPENDENT_AMBULATORY_CARE_PROVIDER_SITE_OTHER): Payer: PRIVATE HEALTH INSURANCE | Admitting: Urgent Care

## 2018-04-30 VITALS — BP 148/94 | HR 74 | Temp 98.7°F | Resp 17 | Ht 70.0 in | Wt 280.0 lb

## 2018-04-30 DIAGNOSIS — L089 Local infection of the skin and subcutaneous tissue, unspecified: Secondary | ICD-10-CM | POA: Diagnosis not present

## 2018-04-30 DIAGNOSIS — I10 Essential (primary) hypertension: Secondary | ICD-10-CM

## 2018-04-30 DIAGNOSIS — H9201 Otalgia, right ear: Secondary | ICD-10-CM | POA: Diagnosis not present

## 2018-04-30 DIAGNOSIS — R030 Elevated blood-pressure reading, without diagnosis of hypertension: Secondary | ICD-10-CM | POA: Diagnosis not present

## 2018-04-30 MED ORDER — CEPHALEXIN 500 MG PO CAPS: 500.0000 mg | ORAL_CAPSULE | Freq: Three times a day (TID) | ORAL | 0 refills | Status: DC

## 2018-04-30 MED ORDER — CHLORTHALIDONE 25 MG PO TABS: 25.0000 mg | ORAL_TABLET | Freq: Every day | ORAL | 0 refills | Status: DC

## 2018-04-30 NOTE — Progress Notes (Signed)
    MRN: [id number] DOB: [birth year]  Subjective:   Gordon GuthrieStephen T Stark is a 52 y.o. male presenting for acute onset of external right ear pain today.  Patient states that he tried to clean his ear, use peroxide but has pain right at the outside of his ear.  Denies fever, drainage of pus or bleeding.  He does admit that the ear has fluid coming out of it regularly.  Gordon Stark has a current medication list which includes the following prescription(s): chlorthalidone, diphenhydramine, knee brace adjustable hinged, and famotidine. Also is allergic to salicylates and fioricet [butalbital-apap-caffeine].  Gordon Stark  has a past medical history of Allergy, Arthritis, Chronic kidney disease, Dysrhythmia, and Hypertension. Also  has a past surgical history that includes Lithotripsy and Cystoscopy.  Objective:   Vitals: BP (!) 148/94   Pulse 74   Temp 98.7 F (37.1 C) (Oral)   Resp 17   Ht 5\' 10"  (1.778 m)   Wt 280 lb (127 kg)   SpO2 98%   BMI 40.18 kg/m   Physical Exam  Constitutional: He is oriented to person, place, and time. He appears well-developed and well-nourished.  HENT:  Right Ear: Tympanic membrane normal.  Left Ear: Tympanic membrane normal.  Ears:  Cardiovascular: Normal rate.  Pulmonary/Chest: Effort normal.  Neurological: He is alert and oriented to person, place, and time.   Assessment and Plan :   Superficial skin infection  Essential hypertension - Plan: chlorthalidone (HYGROTON) 25 MG tablet  Elevated blood pressure reading  Acute pain of right ear  Will start patient on Keflex for superficial skin infection of his ear.  Return to clinic precautions discussed.  At the end of his visit, patient requested a refill of his chlorthalidone.  He states that he has not been able to come back to see his PCP and has been out of his medicine for a month.  I advised patient gave him 90-day supply and counseled that he needs to set up a office visit with his PCP for recheck on his  blood pressure.  Patient is in agreement.  Wallis BambergMario Khristie Sak, PA-C [facility] [phone] 04/30/2018  2:25 PM

## 2018-04-30 NOTE — Patient Instructions (Addendum)
Please return to our clinic if you develop fever, redness, swelling of your ear worsening pain despite taking the antibiotic I am prescribing to you today.    IF you received an x-ray today, you will receive an [facility]. Please contact [facility] at [phone] with questions or concerns regarding your invoice.   IF you received labwork today, you will receive an invoice from [facility]. Please contact [facility] at [phone] with questions or concerns regarding your invoice.   Our billing staff will not be able to assist you with questions regarding bills from these companies.  You will be contacted with the lab results as soon as they are available. The fastest way to get your results is to activate your My Chart account. Instructions are located on the last page of this paperwork. If you have not heard from us regarding the results in 2 weeks, please contact this office.

## 2018-05-28 ENCOUNTER — Encounter: Payer: Self-pay | Admitting: Physician Assistant

## 2018-05-28 ENCOUNTER — Other Ambulatory Visit: Payer: Self-pay

## 2018-05-28 ENCOUNTER — Ambulatory Visit (INDEPENDENT_AMBULATORY_CARE_PROVIDER_SITE_OTHER): Payer: PRIVATE HEALTH INSURANCE | Admitting: Physician Assistant

## 2018-05-28 VITALS — BP 133/94 | HR 93 | Temp 98.6°F | Resp 20 | Ht 71.93 in | Wt 272.2 lb

## 2018-05-28 DIAGNOSIS — Z1211 Encounter for screening for malignant neoplasm of colon: Secondary | ICD-10-CM | POA: Diagnosis not present

## 2018-05-28 DIAGNOSIS — I10 Essential (primary) hypertension: Secondary | ICD-10-CM | POA: Diagnosis not present

## 2018-05-28 DIAGNOSIS — H60391 Other infective otitis externa, right ear: Secondary | ICD-10-CM

## 2018-05-28 MED ORDER — OFLOXACIN 0.3 % OT SOLN: 10.0000 [drp] | Freq: Every day | OTIC | 0 refills | Status: AC

## 2018-05-28 MED ORDER — CHLORTHALIDONE 25 MG PO TABS
25.0000 mg | ORAL_TABLET | Freq: Every day | ORAL | 3 refills | Status: AC
Start: 2018-05-28 — End: ?

## 2018-05-28 MED ORDER — CEPHALEXIN 500 MG PO CAPS: 500.0000 mg | ORAL_CAPSULE | Freq: Three times a day (TID) | ORAL | 0 refills | Status: DC

## 2018-05-28 NOTE — Progress Notes (Signed)
x    MRN: [id number] DOB: [birth year]  Subjective:   Gordon Stark is a 52 y.o. male presenting for follow up on Hypertension and right ear pain.   Currently managed with chlorthalidone 74m daily. Patient is checking blood pressure at home, range is 1676Hsystolic. Reports tolerating medication well. Denies lightheadedness, dizziness, chronic headache, double vision, chest pain, shortness of breath, heart racing, palpitations, nausea, vomiting, abdominal pain, hematuria, lower leg swelling. Lifestyle: Never adds extra salt. Moves around a lot at work. Denies smoking.  Also having recurrent right ear issues.  Saw PA many for this on 05/27/2018.  Was given oral antibiotic.  Notes he cleared up but then returned 3 days ago.  Has some discomfort on the external ear.  Has noticed some drainage.  Denies fever, chills, itching, decreased hearing, tinnitus, deep ear pain, redness, swelling, and warmth.    Has used peroxide with some relief.  Uses Q-tips frequently.  Just purchased near bedside by month ago and uses them regularly.   Gordon Stark a current medication list which includes the following prescription(s): cephalexin, chlorthalidone, diphenhydramine, knee brace adjustable hinged, famotidine, and ofloxacin. Also is allergic to salicylates and fioricet [butalbital-apap-caffeine].  Gordon Stark has a past medical history of Allergy, Arthritis, Chronic kidney disease, Dysrhythmia, and Hypertension. Also  has a past surgical history that includes Lithotripsy and Cystoscopy.   Objective:   Vitals: BP (!) 133/94   Pulse 93   Temp 98.6 F (37 C) (Oral)   Resp 20   Ht 5' 11.93" (1.827 m)   Wt 272 lb 3.2 oz (123.5 kg)   SpO2 99%   BMI 36.99 kg/m   Physical Exam  Constitutional: He is oriented to person, place, and time. He appears well-developed and well-nourished. No distress.  HENT:  Head: Normocephalic and atraumatic.  Right Ear: Tympanic membrane normal. There is drainage (mild amount of  wet light yellowish discharge and flaky white debri within ear canal, no fungal spores noted), swelling (ear canal is moderately edematous and moderately erythematous ) and tenderness (mild tendernes with speculum exam, no tenderness with tragal pressure or manuipulation of auricle). No mastoid tenderness. Tympanic membrane is not perforated, not erythematous and not bulging.  Left Ear: Tympanic membrane, external ear and ear canal normal.  Mouth/Throat: Uvula is midline, oropharynx is clear and moist and mucous membranes are normal. No tonsillar exudate.  External tragus is mildly erythematous.   Eyes: Pupils are equal, round, and reactive to light. Conjunctivae and EOM are normal.  Neck: Normal range of motion.  Cardiovascular: Normal rate, regular rhythm, normal heart sounds and intact distal pulses.  Pulmonary/Chest: Effort normal and breath sounds normal. He has no decreased breath sounds. He has no wheezes. He has no rhonchi. He has no rales.  Musculoskeletal:       Right lower leg: He exhibits no swelling.       Left lower leg: He exhibits no swelling.  Neurological: He is alert and oriented to person, place, and time.  Skin: Skin is warm and dry.  Psychiatric: He has a normal mood and affect.  Vitals reviewed.   No results found for this or any previous visit (from the past 24 hour(s)).   BP Readings from Last 3 Encounters:  05/28/18 (!) 133/94  04/30/18 (!) 148/94  12/04/17 (!) 142/82    Assessment and Plan :  1. Essential hypertension Controlled at this time. Labs pending. F/u in 1 year.  - CBC with Differential/Platelet - CMP14+EGFR - Lipid panel -  TSH - Urinalysis, dipstick only - chlorthalidone (HYGROTON) 25 MG tablet; Take 1 tablet (25 mg total) by mouth daily.  Dispense: 90 tablet; Refill: 3  2. Infective otitis externa of right ear From review of last OV note, it appears he was tx for superficial skin infection. Today's appearance consistent with otitis externa  but the external tragus is mildly erythematous as well. He did have improvement with oral abx, but sx returned. Will treat with both topical and oral agents at this time. Rec cleansing ear buds and avoid use while being treated. Return to clinic if symptoms worsen, do not improve, or as needed. If no improvement at that time, consider referral to ENT.  - ofloxacin (FLOXIN) 0.3 % OTIC solution; Place 10 drops into the right ear daily for 7 days.  Dispense: 10 mL; Refill: 0 - cephALEXin (KEFLEX) 500 MG capsule; Take 1 capsule (500 mg total) by mouth 3 (three) times daily.  Dispense: 21 capsule; Refill: 0  3. Screen for colon cancer - Ambulatory referral to Gastroenterology  Gordon Delaine, PA-C  [facility] at [facility] 05/28/2018 11:29 AM

## 2018-05-28 NOTE — Patient Instructions (Addendum)
For blood pressure, continue med as you are. We have checked labs today and should have results within one week.  For ear pain, use topical drops and oral antibiotic. Return to clinic if symptoms worsen, do not improve, or as needed  To use the ear drops: -Lie down or tilt your head with your ear facing upward. Open the ear canal by gently pulling your ear back, or pulling downward on the earlobe when giving this medicine to a child. -Hold the dropper upside down over your ear and drop the correct number of drops into the ear. -Stay lying down or with your head tilted for at least 5 minutes. You may use a small piece of cotton to plug the ear and keep the medicine from draining out. -Do not touch the dropper tip or place it directly in your ear. It may become contaminated. Wipe the tip with a clean tissue but do not wash with water or soap. -Use this medicine for the full prescribed length of time. Your symptoms may improve before the infection is completely cleared. Skipping doses may also increase your risk of further infection that is resistant to antibiotics.  DASH Eating Plan DASH stands for "Dietary Approaches to Stop Hypertension." The DASH eating plan is a healthy eating plan that has been shown to reduce high blood pressure (hypertension). It may also reduce your risk for type 2 diabetes, heart disease, and stroke. The DASH eating plan may also help with weight loss. What are tips for following this plan? General guidelines  Avoid eating more than 2,300 mg (milligrams) of salt (sodium) a day. If you have hypertension, you may need to reduce your sodium intake to 1,500 mg a day.  Limit alcohol intake to no more than 1 drink a day for nonpregnant women and 2 drinks a day for men. One drink equals 12 oz of beer, 5 oz of wine, or 1 oz of hard liquor.  Work with your health care provider to maintain a healthy body weight or to lose weight. Ask what an ideal weight is for you.  Get at least  30 minutes of exercise that causes your heart to beat faster (aerobic exercise) most days of the week. Activities may include walking, swimming, or biking.  Work with your health care provider or diet and nutrition specialist (dietitian) to adjust your eating plan to your individual calorie needs. Reading food labels  Check food labels for the amount of sodium per serving. Choose foods with less than 5 percent of the Daily Value of sodium. Generally, foods with less than 300 mg of sodium per serving fit into this eating plan.  To find whole grains, look for the word "whole" as the first word in the ingredient list. Shopping  Buy products labeled as "low-sodium" or "no salt added."  Buy fresh foods. Avoid canned foods and premade or frozen meals. Cooking  Avoid adding salt when cooking. Use salt-free seasonings or herbs instead of table salt or sea salt. Check with your health care provider or pharmacist before using salt substitutes.  Do not fry foods. Cook foods using healthy methods such as baking, boiling, grilling, and broiling instead.  Cook with heart-healthy oils, such as olive, canola, soybean, or sunflower oil. Meal planning   Eat a balanced diet that includes: ? 5 or more servings of fruits and vegetables each day. At each meal, try to fill half of your plate with fruits and vegetables. ? Up to 6-8 servings of whole grains each  day. ? Less than 6 oz of lean meat, poultry, or fish each day. A 3-oz serving of meat is about the same size as a deck of cards. One egg equals 1 oz. ? 2 servings of low-fat dairy each day. ? A serving of nuts, seeds, or beans 5 times each week. ? Heart-healthy fats. Healthy fats called Omega-3 fatty acids are found in foods such as flaxseeds and coldwater fish, like sardines, salmon, and mackerel.  Limit how much you eat of the following: ? Canned or prepackaged foods. ? Food that is high in trans fat, such as fried foods. ? Food that is high in  saturated fat, such as fatty meat. ? Sweets, desserts, sugary drinks, and other foods with added sugar. ? Full-fat dairy products.  Do not salt foods before eating.  Try to eat at least 2 vegetarian meals each week.  Eat more home-cooked food and less restaurant, buffet, and fast food.  When eating at a restaurant, ask that your food be prepared with less salt or no salt, if possible. What foods are recommended? The items listed may not be a complete list. Talk with your dietitian about what dietary choices are best for you. Grains Whole-grain or whole-wheat bread. Whole-grain or whole-wheat pasta. Brown rice. Orpah Cobbatmeal. Quinoa. Bulgur. Whole-grain and low-sodium cereals. Pita bread. Low-fat, low-sodium crackers. Whole-wheat flour tortillas. Vegetables Fresh or frozen vegetables (raw, steamed, roasted, or grilled). Low-sodium or reduced-sodium tomato and vegetable juice. Low-sodium or reduced-sodium tomato sauce and tomato paste. Low-sodium or reduced-sodium canned vegetables. Fruits All fresh, dried, or frozen fruit. Canned fruit in natural juice (without added sugar). Meat and other protein foods Skinless chicken or Malawiturkey. Ground chicken or Mal[place]iturkey. Pork with fat trimmed off. Fish and seafood. Egg whites. Dried beans, peas, or lentils. Unsalted nuts, nut butters, and seeds. Unsalted canned beans. Lean cuts of beef with fat trimmed off. Low-sodium, lean deli meat. Dairy Low-fat (1%) or fat-free (skim) milk. Fat-free, low-fat, or reduced-fat cheeses. Nonfat, low-sodium ricotta or cottage cheese. Low-fat or nonfat yogurt. Low-fat, low-sodium cheese. Fats and oils Soft margarine without trans fats. Vegetable oil. Low-fat, reduced-fat, or light mayonnaise and salad dressings (reduced-sodium). Canola, safflower, olive, soybean, and sunflower oils. Avocado. Seasoning and other foods Herbs. Spices. Seasoning mixes without salt. Unsalted popcorn and pretzels. Fat-free sweets. What foods are not  recommended? The items listed may not be a complete list. Talk with your dietitian about what dietary choices are best for you. Grains Baked goods made with fat, such as croissants, muffins, or some breads. Dry pasta or rice meal packs. Vegetables Creamed or fried vegetables. Vegetables in a cheese sauce. Regular canned vegetables (not low-sodium or reduced-sodium). Regular canned tomato sauce and paste (not low-sodium or reduced-sodium). Regular tomato and vegetable juice (not low-sodium or reduced-sodium). Rosita FirePickles. Olives. Fruits Canned fruit in a light or heavy syrup. Fried fruit. Fruit in cream or butter sauce. Meat and other protein foods Fatty cuts of meat. Ribs. Fried meat. Tomasa BlaseBacon. Sausage. Bologna and other processed lunch meats. Salami. Fatback. Hotdogs. Bratwurst. Salted nuts and seeds. Canned beans with added salt. Canned or smoked fish. Whole eggs or egg yolks. Chicken or Malawiturkey with skin. Dairy Whole or 2% milk, cream, and half-and-half. Whole or full-fat cream cheese. Whole-fat or sweetened yogurt. Full-fat cheese. Nondairy creamers. Whipped toppings. Processed cheese and cheese spreads. Fats and oils Butter. Stick margarine. Lard. Shortening. Ghee. Bacon fat. Tropical oils, such as coconut, palm kernel, or palm oil. Seasoning and other foods Salted popcorn and pretzels.  Onion salt, garlic salt, seasoned salt, table salt, and sea salt. Worcestershire sauce. Tartar sauce. Barbecue sauce. Teriyaki sauce. Soy sauce, including reduced-sodium. Steak sauce. Canned and packaged gravies. Fish sauce. Oyster sauce. Cocktail sauce. Horseradish that you find on the shelf. Ketchup. Mustard. Meat flavorings and tenderizers. Bouillon cubes. Hot sauce and Tabasco sauce. Premade or packaged marinades. Premade or packaged taco seasonings. Relishes. Regular salad dressings. Where to find more information:  National Heart, Lung, and Blood Institute: PopSteam.is  American Heart Association:  www.heart.org Summary  The DASH eating plan is a healthy eating plan that has been shown to reduce high blood pressure (hypertension). It may also reduce your risk for type 2 diabetes, heart disease, and stroke.  With the DASH eating plan, you should limit salt (sodium) intake to 2,300 mg a day. If you have hypertension, you may need to reduce your sodium intake to 1,500 mg a day.  When on the DASH eating plan, aim to eat more fresh fruits and vegetables, whole grains, lean proteins, low-fat dairy, and heart-healthy fats.  Work with your health care provider or diet and nutrition specialist (dietitian) to adjust your eating plan to your individual calorie needs. This information is not intended to replace advice given to you by your health care provider. Make sure you discuss any questions you have with your health care provider. Document Released: 10/04/2011 Document Revised: 10/08/2016 Document Reviewed: 10/08/2016 Elsevier Interactive Patient Education  2018 [employer].  Otitis Externa Otitis externa is an infection of the outer ear canal. The outer ear canal is the area between the outside of the ear and the eardrum. Otitis externa is sometimes called "swimmer's ear." Follow these instructions at home:  If you were given antibiotic ear drops, use them as told by your doctor. Do not stop using them even if your condition gets better.  Take over-the-counter and prescription medicines only as told by your doctor.  Keep all follow-up visits as told by your doctor. This is important. How is this prevented?  Keep your ear dry. Use the corner of a towel to dry your ear after you swim or bathe.  Try not to scratch or put things in your ear. Doing these things makes it easier for germs to grow in your ear.  Avoid swimming in lakes, dirty water, or pools that may not have the right amount of a chemical called chlorine.  Consider making ear drops and putting 3 or 4 drops in each ear after  you swim. Ask your doctor about how you can make ear drops. Contact a doctor if:  You have a fever.  After 3 days your ear is still red, swollen, or painful.  After 3 days you still have pus coming from your ear.  Your redness, swelling, or pain gets worse.  You have a really bad headache.  You have redness, swelling, pain, or tenderness behind your ear. This information is not intended to replace advice given to you by your health care provider. Make sure you discuss any questions you have with your health care provider. Document Released: 04/02/2008 Document Revised: 11/10/2015 Document Reviewed: 07/25/2015 Elsevier Interactive Patient Education  2018 [employer].    IF you received an x-ray today, you will receive an invoice from [facility]. Please contact [facility] at [phone] with questions or concerns regarding your invoice.   IF you received labwork today, you will receive an invoice from [facility]. Please contact [facility] at [phone] with questions or concerns regarding your invoice.  Our billing staff will not be able to assist you with questions regarding bills from these companies.  You will be contacted with the lab results as soon as they are available. The fastest way to get your results is to activate your My Chart account. Instructions are located on the last page of this paperwork. If you have not heard from us regarding the results in 2 weeks, please contact this office.     

## 2018-05-29 LAB — URINALYSIS, DIPSTICK ONLY
BILIRUBIN UA: NEGATIVE
GLUCOSE, UA: NEGATIVE
KETONES UA: NEGATIVE
Leukocytes, UA: NEGATIVE
Nitrite, UA: NEGATIVE
PH UA: 7 (ref 5.0–7.5)
Protein, UA: NEGATIVE
RBC UA: NEGATIVE
SPEC GRAV UA: 1.021 (ref 1.005–1.030)
UUROB: 0.2 mg/dL (ref 0.2–1.0)

## 2018-05-29 LAB — CBC WITH DIFFERENTIAL/PLATELET
Basophils Absolute: 0.1 10*3/uL (ref 0.0–0.2)
Basos: 1 %
EOS (ABSOLUTE): 0.2 10*3/uL (ref 0.0–0.4)
Eos: 3 %
Hematocrit: 43.3 % (ref 37.5–51.0)
Hemoglobin: 14.7 g/dL (ref 13.0–17.7)
Immature Grans (Abs): 0 10*3/uL (ref 0.0–0.1)
Immature Granulocytes: 0 %
Lymphocytes Absolute: 2.4 10*3/uL (ref 0.7–3.1)
Lymphs: 32 %
MCH: 30.4 pg (ref 26.6–33.0)
MCHC: 33.9 g/dL (ref 31.5–35.7)
MCV: 90 fL (ref 79–97)
MONOS ABS: 0.6 10*3/uL (ref 0.1–0.9)
Monocytes: 8 %
NEUTROS PCT: 56 %
Neutrophils Absolute: 4.4 10*3/uL (ref 1.4–7.0)
PLATELETS: 231 10*3/uL (ref 150–450)
RBC: 4.83 x10E6/uL (ref 4.14–5.80)
RDW: 12.7 % (ref 12.3–15.4)
WBC: 7.7 10*3/uL (ref 3.4–10.8)

## 2018-05-29 LAB — CMP14+EGFR
ALT: 36 IU/L (ref 0–44)
AST: 25 IU/L (ref 0–40)
Albumin/Globulin Ratio: 1.6 (ref 1.2–2.2)
Albumin: 4.2 g/dL (ref 3.5–5.5)
Alkaline Phosphatase: 66 IU/L (ref 39–117)
BUN/Creatinine Ratio: 14 (ref 9–20)
BUN: 14 mg/dL (ref 6–24)
Bilirubin Total: 0.3 mg/dL (ref 0.0–1.2)
CALCIUM: 9.5 mg/dL (ref 8.7–10.2)
CO2: 27 mmol/L (ref 20–29)
CREATININE: 1.01 mg/dL (ref 0.76–1.27)
Chloride: 102 mmol/L (ref 96–106)
GFR calc Af Amer: 99 mL/min/{1.73_m2} (ref >59)
GFR, EST NON AFRICAN AMERICAN: 86 mL/min/{1.73_m2} (ref >59)
GLUCOSE: 89 mg/dL (ref 65–99)
Globulin, Total: 2.6 g/dL (ref 1.5–4.5)
Potassium: 3.7 mmol/L (ref 3.5–5.2)
Sodium: 144 mmol/L (ref 134–144)
Total Protein: 6.8 g/dL (ref 6.0–8.5)

## 2018-05-29 LAB — LIPID PANEL
CHOLESTEROL TOTAL: 128 mg/dL (ref 100–199)
Chol/HDL Ratio: 2.2 ratio (ref 0.0–5.0)
HDL: 57 mg/dL (ref >39)
LDL CALC: 51 mg/dL (ref 0–99)
TRIGLYCERIDES: 98 mg/dL (ref 0–149)
VLDL Cholesterol Cal: 20 mg/dL (ref 5–40)

## 2018-05-29 LAB — TSH: TSH: 2.43 u[IU]/mL (ref 0.450–4.500)

## 2018-06-02 ENCOUNTER — Ambulatory Visit: Payer: Self-pay | Admitting: Physician Assistant

## 2018-07-01 ENCOUNTER — Encounter: Payer: Self-pay | Admitting: Physician Assistant

## 2018-10-10 ENCOUNTER — Other Ambulatory Visit: Payer: Self-pay

## 2018-10-10 ENCOUNTER — Encounter (HOSPITAL_BASED_OUTPATIENT_CLINIC_OR_DEPARTMENT_OTHER): Payer: Self-pay | Admitting: *Deleted

## 2018-10-10 ENCOUNTER — Other Ambulatory Visit: Payer: Self-pay | Admitting: Orthopedic Surgery

## 2018-10-15 ENCOUNTER — Encounter (HOSPITAL_BASED_OUTPATIENT_CLINIC_OR_DEPARTMENT_OTHER)
Admission: RE | Admit: 2018-10-15 | Discharge: 2018-10-15 | Disposition: A | Discharge status: Home / Self Care | Payer: Federal, State, Local not specified - PPO | Source: Ambulatory Visit | Attending: Orthopedic Surgery | Admitting: Orthopedic Surgery

## 2018-10-15 DIAGNOSIS — Z01818 Encounter for other preprocedural examination: Secondary | ICD-10-CM | POA: Diagnosis not present

## 2018-10-15 DIAGNOSIS — X58XXXA Exposure to other specified factors, initial encounter: Secondary | ICD-10-CM | POA: Insufficient documentation

## 2018-10-15 DIAGNOSIS — S8261XA Displaced fracture of lateral malleolus of right fibula, initial encounter for closed fracture: Secondary | ICD-10-CM | POA: Insufficient documentation

## 2018-10-15 DIAGNOSIS — I440 Atrioventricular block, first degree: Secondary | ICD-10-CM | POA: Diagnosis not present

## 2018-10-15 LAB — BASIC METABOLIC PANEL
Anion gap: 11 (ref 5–15)
BUN: 12 mg/dL (ref 6–20)
CO2: 27 mmol/L (ref 22–32)
Calcium: 9.1 mg/dL (ref 8.9–10.3)
Chloride: 102 mmol/L (ref 98–111)
Creatinine, Ser: 0.98 mg/dL (ref 0.61–1.24)
GFR calc Af Amer: >60 mL/min (ref >60)
GFR calc non Af Amer: >60 mL/min (ref >60)
Glucose, Bld: 115 mg/dL — ABNORMAL HIGH (ref 70–99)
Potassium: 3.2 mmol/L — ABNORMAL LOW (ref 3.5–5.1)
Sodium: 140 mmol/L (ref 135–145)

## 2018-10-15 NOTE — Progress Notes (Signed)
EKG viewed per Dr Renold DonGermeroth. Change from previous EKG. !st degree AV BLOCK. Will proceed with surgery as planned.

## 2018-10-16 NOTE — Progress Notes (Signed)
K+ 3.2, notified Dr. R.Fitzgerald, will proceed with surgery as scheduled.

## 2018-10-16 NOTE — H&P (Signed)
Gordon GuthrieStephen T Koroma is an 52 y.o. male.   CC / Reason for Visit: Bilateral hand contractures HPI: This patient presents for evaluation, primarily of his right hand.  He has had significant progression in his Dupuytren's disease since I last evaluated him approaching 5 years ago.  The digit is now so contracted that has become functionally awkward.  HPI 01-07-14: This patient is a 52 year old male who presents for evaluation of contractures involving both hands.  On the right side he has developed a contracture to the small finger with a palmar cord over the course of about 13 years.  On the left side it is been only about 2 years.  On the right side it is more developed and goes to the radial border of the small finger.  The right side has never been painful, for the last couple months he has had some pain associated with the developing cords and nodules in the left hand  Past Medical History:  Diagnosis Date  . Allergy   . Arthritis   . Chronic kidney disease    kidney stones  . Dysrhythmia   . Hypertension     Past Surgical History:  Procedure Laterality Date  . CYSTOSCOPY     6 times  . LITHOTRIPSY     4528yrs ago    Family History  Problem Relation Age of Onset  . Cancer Father   . Hypertension Father   . Cancer Maternal Grandmother   . Diabetes Paternal Grandfather    Social History:  reports that he quit smoking about 27 years ago. His smoking use included cigarettes. His smokeless tobacco use includes chew. He reports current alcohol use. He reports that he does not use drugs.  Allergies:  Allergies  Allergen Reactions  . Salicylates Other (See Comments)    Lips swell so bad they can bust  . Fioricet [Butalbital-Apap-Caffeine] Palpitations    No medications prior to admission.    Results for orders placed or performed during the hospital encounter of 10/20/18 (from the past 48 hour(s))  Basic metabolic panel     Status: Abnormal   Collection Time: 10/15/18  3:30 PM   Result Value Ref Range   Sodium 140 135 - 145 mmol/L   Potassium 3.2 (L) 3.5 - 5.1 mmol/L   Chloride 102 98 - 111 mmol/L   CO2 27 22 - 32 mmol/L   Glucose, Bld 115 (H) 70 - 99 mg/dL   BUN 12 6 - 20 mg/dL   Creatinine, Ser 1.610.98 0.61 - 1.24 mg/dL   Calcium 9.1 8.9 - 09.610.3 mg/dL   GFR calc non Af Amer >60 >60 mL/min   GFR calc Af Amer >60 >60 mL/min   Anion gap 11 5 - 15    Comment: Performed at [facility], [address] [place], [place] [id number]   No results found.  Review of Systems  All other systems reviewed and are negative.   Height 5\' 11"  (1.803 m), weight 120.2 kg. Physical Exam  Constitutional:  WD, WN, NAD HEENT:  NCAT, EOMI Neuro/Psych:  Alert & oriented to person, place, and time; appropriate mood & affect Lymphatic: No generalized UE edema or lymphadenopathy Extremities / MSK:  Both UE are normal with respect to appearance, ranges of motion, joint stability, muscle strength/tone, sensation, & perfusion except as otherwise noted:  Bilaterally there are pretendinous cords to the small fingers.  On the right side there is now 75 contracture at the PIP joint with a volar  radial dense Dupuytren's cord.  There may be some slight delayed filling from the radial digital artery as well.  No significant contracture at the MP joint.  Labs / Xrays:  No radiographic studies obtained today.  Assessment: Bilateral Dupuytren's disease, right small finger most dramatically affected  Plan:  I discussed this entity with the patient.  I indicated that I recommended open treatment for this rather than XIAFLEX, given the layout of his cord and the possibility of neurovascular injury.  He would like to proceed before the end of the year, we will try to accommodate this.  I did discuss with him in brief detail the postoperative expectations, etc.  The details of the operative procedure were discussed with the patient.  Questions were invited and answered.  In addition to the goal  of the procedure, the risks of the procedure to include but not limited to bleeding; infection; damage to the nerves or blood vessels that could result in bleeding, numbness, weakness, chronic pain, and the need for additional procedures; stiffness; the need for revision surgery; and anesthetic risks were reviewed.  No specific outcome was guaranteed or implied.  Informed consent was obtained.  Jodi Marbleavid A Osbaldo Mark, MD 10/16/2018, 11:09 PM

## 2018-10-20 ENCOUNTER — Ambulatory Visit (HOSPITAL_BASED_OUTPATIENT_CLINIC_OR_DEPARTMENT_OTHER): Payer: POS | Admitting: Anesthesiology

## 2018-10-20 ENCOUNTER — Encounter (HOSPITAL_BASED_OUTPATIENT_CLINIC_OR_DEPARTMENT_OTHER)
Admission: RE | Disposition: A | Discharge status: Home / Self Care | Payer: Self-pay | Source: Home / Self Care | Attending: Orthopedic Surgery

## 2018-10-20 ENCOUNTER — Encounter (HOSPITAL_BASED_OUTPATIENT_CLINIC_OR_DEPARTMENT_OTHER): Payer: Self-pay

## 2018-10-20 ENCOUNTER — Ambulatory Visit (HOSPITAL_BASED_OUTPATIENT_CLINIC_OR_DEPARTMENT_OTHER)
Admission: RE | Admit: 2018-10-20 | Discharge: 2018-10-20 | Disposition: A | Discharge status: Home / Self Care | Payer: POS | Attending: Orthopedic Surgery | Admitting: Orthopedic Surgery

## 2018-10-20 ENCOUNTER — Other Ambulatory Visit: Payer: Self-pay

## 2018-10-20 DIAGNOSIS — Z87891 Personal history of nicotine dependence: Secondary | ICD-10-CM | POA: Insufficient documentation

## 2018-10-20 DIAGNOSIS — N189 Chronic kidney disease, unspecified: Secondary | ICD-10-CM | POA: Insufficient documentation

## 2018-10-20 DIAGNOSIS — M720 Palmar fascial fibromatosis [Dupuytren]: Secondary | ICD-10-CM | POA: Diagnosis not present

## 2018-10-20 DIAGNOSIS — I129 Hypertensive chronic kidney disease with stage 1 through stage 4 chronic kidney disease, or unspecified chronic kidney disease: Secondary | ICD-10-CM | POA: Insufficient documentation

## 2018-10-20 DIAGNOSIS — M24541 Contracture, right hand: Secondary | ICD-10-CM | POA: Diagnosis present

## 2018-10-20 HISTORY — PX: DUPUYTREN CONTRACTURE RELEASE: SHX1478

## 2018-10-20 SURGERY — RELEASE, DUPUYTREN CONTRACTURE
Anesthesia: General | Site: Hand | Laterality: Right

## 2018-10-20 MED ORDER — LIDOCAINE 2% (20 MG/ML) 5 ML SYRINGE
INTRAMUSCULAR | Status: DC | PRN
  Administered 2018-10-20: 20 mg via INTRAVENOUS

## 2018-10-20 MED ORDER — ONDANSETRON HCL 4 MG/2ML IJ SOLN
INTRAMUSCULAR | Status: DC | PRN
  Administered 2018-10-20: 4 mg via INTRAVENOUS

## 2018-10-20 MED ORDER — PROPOFOL 10 MG/ML IV BOLUS
INTRAVENOUS | Status: DC | PRN
  Administered 2018-10-20: 300 mg via INTRAVENOUS

## 2018-10-20 MED ORDER — FENTANYL CITRATE (PF) 100 MCG/2ML IJ SOLN: 25.0000 ug | INTRAMUSCULAR | Status: DC | PRN

## 2018-10-20 MED ORDER — LACTATED RINGERS IV SOLN
INTRAVENOUS | Status: DC
  Administered 2018-10-20: 08:00:00 via INTRAVENOUS

## 2018-10-20 MED ORDER — FENTANYL CITRATE (PF) 100 MCG/2ML IJ SOLN
50.0000 ug | INTRAMUSCULAR | Status: DC | PRN
  Administered 2018-10-20: 100 ug via INTRAVENOUS

## 2018-10-20 MED ORDER — BUPIVACAINE HCL (PF) 0.5 % IJ SOLN
INTRAMUSCULAR | Status: DC | PRN
  Administered 2018-10-20: 30 mL via PERINEURAL

## 2018-10-20 MED ORDER — LACTATED RINGERS IV SOLN: INTRAVENOUS | Status: DC

## 2018-10-20 MED ORDER — DIPHENHYDRAMINE HCL 50 MG/ML IJ SOLN
INTRAMUSCULAR | Status: AC
  Filled 2018-10-20: qty 1

## 2018-10-20 MED ORDER — PROMETHAZINE HCL 25 MG/ML IJ SOLN: 6.2500 mg | INTRAMUSCULAR | Status: DC | PRN

## 2018-10-20 MED ORDER — SCOPOLAMINE 1 MG/3DAYS TD PT72: 1.0000 | MEDICATED_PATCH | Freq: Once | TRANSDERMAL | Status: DC | PRN

## 2018-10-20 MED ORDER — LIDOCAINE 2% (20 MG/ML) 5 ML SYRINGE
INTRAMUSCULAR | Status: AC
  Filled 2018-10-20: qty 5

## 2018-10-20 MED ORDER — OXYCODONE HCL 5 MG PO TABS: 0.0000 mg | ORAL_TABLET | Freq: Four times a day (QID) | ORAL | 0 refills | Status: AC | PRN

## 2018-10-20 MED ORDER — FENTANYL CITRATE (PF) 100 MCG/2ML IJ SOLN
INTRAMUSCULAR | Status: AC
  Filled 2018-10-20: qty 2

## 2018-10-20 MED ORDER — MIDAZOLAM HCL 2 MG/2ML IJ SOLN
INTRAMUSCULAR | Status: AC
  Filled 2018-10-20: qty 2

## 2018-10-20 MED ORDER — CEFAZOLIN SODIUM-DEXTROSE 2-4 GM/100ML-% IV SOLN: 2.0000 g | INTRAVENOUS | Status: DC

## 2018-10-20 MED ORDER — ACETAMINOPHEN 325 MG PO TABS: 650.0000 mg | ORAL_TABLET | Freq: Four times a day (QID) | ORAL | Status: AC

## 2018-10-20 MED ORDER — CEFAZOLIN SODIUM-DEXTROSE 2-4 GM/100ML-% IV SOLN
INTRAVENOUS | Status: AC
  Filled 2018-10-20: qty 200

## 2018-10-20 MED ORDER — MIDAZOLAM HCL 2 MG/2ML IJ SOLN
1.0000 mg | INTRAMUSCULAR | Status: DC | PRN
  Administered 2018-10-20: 2 mg via INTRAVENOUS

## 2018-10-20 MED ORDER — DIPHENHYDRAMINE HCL 50 MG/ML IJ SOLN
25.0000 mg | Freq: Once | INTRAMUSCULAR | Status: AC
  Administered 2018-10-20: 25 mg via INTRAVENOUS

## 2018-10-20 MED ORDER — DEXAMETHASONE SODIUM PHOSPHATE 10 MG/ML IJ SOLN
INTRAMUSCULAR | Status: DC | PRN
  Administered 2018-10-20: 10 mg via INTRAVENOUS

## 2018-10-20 MED ORDER — 0.9 % SODIUM CHLORIDE (POUR BTL) OPTIME
TOPICAL | Status: DC | PRN
  Administered 2018-10-20: 1000 mL

## 2018-10-20 MED ORDER — DEXTROSE 5 % IV SOLN
3.0000 g | INTRAVENOUS | Status: AC
  Administered 2018-10-20: 3 g via INTRAVENOUS

## 2018-10-20 MED ORDER — PROPOFOL 10 MG/ML IV BOLUS
INTRAVENOUS | Status: AC
  Filled 2018-10-20: qty 20

## 2018-10-20 MED ORDER — FENTANYL CITRATE (PF) 250 MCG/5ML IJ SOLN
INTRAMUSCULAR | Status: DC | PRN
  Administered 2018-10-20 (×4): 25 ug via INTRAVENOUS

## 2018-10-20 SURGICAL SUPPLY — 48 items
BLADE MINI RND TIP GREEN BEAV (BLADE) ×2 IMPLANT
BLADE SURG 15 STRL LF DISP TIS (BLADE) ×1 IMPLANT
BLADE SURG 15 STRL SS (BLADE) ×4
BNDG CMPR 9X4 STRL LF SNTH (GAUZE/BANDAGES/DRESSINGS)
BNDG COHESIVE 4X5 TAN STRL (GAUZE/BANDAGES/DRESSINGS) ×2 IMPLANT
BNDG ESMARK 4X9 LF (GAUZE/BANDAGES/DRESSINGS) IMPLANT
BNDG GAUZE ELAST 4 BULKY (GAUZE/BANDAGES/DRESSINGS) ×2 IMPLANT
CHLORAPREP W/TINT 26ML (MISCELLANEOUS) ×2 IMPLANT
CORD BIPOLAR FORCEPS 12FT (ELECTRODE) ×2 IMPLANT
COVER BACK TABLE 60X90IN (DRAPES) ×2 IMPLANT
COVER MAYO STAND STRL (DRAPES) ×2 IMPLANT
COVER WAND RF STERILE (DRAPES) IMPLANT
CUFF TOURNIQUET SINGLE 18IN (TOURNIQUET CUFF) IMPLANT
DRAPE EXTREMITY T 121X128X90 (DRAPE) ×2 IMPLANT
DRAPE SURG 17X23 STRL (DRAPES) ×2 IMPLANT
DRSG EMULSION OIL 3X3 NADH (GAUZE/BANDAGES/DRESSINGS) ×2 IMPLANT
GAUZE SPONGE 4X4 12PLY STRL LF (GAUZE/BANDAGES/DRESSINGS) ×2 IMPLANT
GLOVE BIO SURGEON STRL SZ7.5 (GLOVE) ×2 IMPLANT
GLOVE BIOGEL PI IND STRL 7.0 (GLOVE) ×1 IMPLANT
GLOVE BIOGEL PI IND STRL 8 (GLOVE) ×1 IMPLANT
GLOVE BIOGEL PI INDICATOR 7.0 (GLOVE) ×1
GLOVE BIOGEL PI INDICATOR 8 (GLOVE) ×1
GLOVE ECLIPSE 6.5 STRL STRAW (GLOVE) ×2 IMPLANT
GOWN STRL REUS W/ TWL LRG LVL3 (GOWN DISPOSABLE) ×2 IMPLANT
GOWN STRL REUS W/TWL LRG LVL3 (GOWN DISPOSABLE) ×4
GOWN STRL REUS W/TWL XL LVL3 (GOWN DISPOSABLE) ×2 IMPLANT
NDL HYPO 25X1 1.5 SAFETY (NEEDLE) IMPLANT
NDL SAFETY ECLIPSE 18X1.5 (NEEDLE) IMPLANT
NEEDLE HYPO 18GX1.5 SHARP (NEEDLE)
NEEDLE HYPO 25X1 1.5 SAFETY (NEEDLE) IMPLANT
NS IRRIG 1000ML POUR BTL (IV SOLUTION) ×2 IMPLANT
PACK BASIN DAY SURGERY FS (CUSTOM PROCEDURE TRAY) ×2 IMPLANT
PADDING CAST ABS 4INX4YD NS (CAST SUPPLIES) ×1
PADDING CAST ABS COTTON 4X4 ST (CAST SUPPLIES) ×1 IMPLANT
RUBBERBAND STERILE (MISCELLANEOUS) ×2 IMPLANT
SLEEVE SCD COMPRESS KNEE MED (MISCELLANEOUS) ×2 IMPLANT
SPLINT PLASTER CAST XFAST 3X15 (CAST SUPPLIES) ×8 IMPLANT
SPLINT PLASTER XTRA FASTSET 3X (CAST SUPPLIES) ×8
STOCKINETTE 6  STRL (DRAPES) ×1
STOCKINETTE 6 STRL (DRAPES) ×1 IMPLANT
SUT SILK 3 0 SH CR/8 (SUTURE) IMPLANT
SUT VICRYL RAPIDE 4-0 (SUTURE) IMPLANT
SUT VICRYL RAPIDE 4/0 PS 2 (SUTURE) ×2 IMPLANT
SYR 10ML LL (SYRINGE) IMPLANT
SYR BULB 3OZ (MISCELLANEOUS) IMPLANT
TOWEL GREEN STERILE FF (TOWEL DISPOSABLE) ×2 IMPLANT
TOWEL OR NON WOVEN STRL DISP B (DISPOSABLE) ×2 IMPLANT
UNDERPAD 30X30 (UNDERPADS AND DIAPERS) ×2 IMPLANT

## 2018-10-20 NOTE — Anesthesia Procedure Notes (Signed)
Procedure Name: LMA Insertion Date/Time: 10/20/2018 9:46 AM Performed by: Lucinda Dellecarlo, Zayley Arras M, CRNA Pre-anesthesia Checklist: Patient identified, Emergency Drugs available, Suction available and Patient being monitored Patient Re-evaluated:Patient Re-evaluated prior to induction Oxygen Delivery Method: Circle system utilized Preoxygenation: Pre-oxygenation with 100% oxygen Induction Type: IV induction LMA: LMA inserted LMA Size: 5.0 Tube type: Oral Number of attempts: 1 Placement Confirmation: positive ETCO2 and breath sounds checked- equal and bilateral Tube secured with: Tape Dental Injury: Teeth and Oropharynx as per pre-operative assessment

## 2018-10-20 NOTE — Anesthesia Preprocedure Evaluation (Signed)
Anesthesia Evaluation  Patient identified by MRN, date of birth, ID band Patient awake    Reviewed: Allergy & Precautions, NPO status , Patient's Chart, lab work & pertinent test results  Airway Mallampati: II  TM Distance: >3 FB     Dental  (+) Dental Advisory Given   Pulmonary former smoker,    breath sounds clear to auscultation       Cardiovascular hypertension,  Rhythm:Regular Rate:Normal     Neuro/Psych negative neurological ROS     GI/Hepatic negative GI ROS, Neg liver ROS,   Endo/Other  negative endocrine ROS  Renal/GU negative Renal ROS     Musculoskeletal  (+) Arthritis ,   Abdominal   Peds  Hematology negative hematology ROS (+)   Anesthesia Other Findings   Reproductive/Obstetrics                             Anesthesia Physical Anesthesia Plan  ASA: II  Anesthesia Plan: General   Post-op Pain Management:  Regional for Post-op pain   Induction: Intravenous  PONV Risk Score and Plan: 2 and Dexamethasone, Ondansetron and Treatment may vary due to age or medical condition  Airway Management Planned: LMA  Additional Equipment:   Intra-op Plan:   Post-operative Plan: Extubation in OR  Informed Consent: I have reviewed the patients History and Physical, chart, labs and discussed the procedure including the risks, benefits and alternatives for the proposed anesthesia with the patient or authorized representative who has indicated his/her understanding and acceptance.   Dental advisory given  Plan Discussed with: CRNA  Anesthesia Plan Comments:         Anesthesia Quick Evaluation

## 2018-10-20 NOTE — Op Note (Signed)
10/20/2018  7:44 AM  PATIENT:  Gordon Stark  52 y.o. male  PRE-OPERATIVE DIAGNOSIS: Right hand and small finger Dupuytren's disease  POST-OPERATIVE DIAGNOSIS:  Same  PROCEDURE:  1.  Right partial palmar fasciectomy to include single digit release of small finger    2.  Manipulation of right small finger PIP joint under anesthesia  SURGEON: Alexus Michael A. Sabino Denning, MD  PHYSICIAN ASSISTANT: Kirsten Schrader, OPA-C  ANESTHESIA:  regional and general  SPECIMENS: To pathology  DRAINS:   None  EBL:  less than 50 mL  PREOPERATIVE INDICATIONS:  [name] palm, as it traversed into the small finger it  was quite radial of midline.  In fact, it was a spiral cord with the neurovascular structures wrapped around it.  This resulted in the neurovascular structures being pulled quite a bit into the midline and even the ulnar side of the digit at one point distally.  As the skin flaps for elevated off of the palmar fascia carefully, the spiral cord was identified and care taken to protect and preserve the neurovascular structures.  Distally the disease was quite adherent to and invading the dermis on the radial side of the digit at the level of the PIP joint.  In essence, dissection required devising a plane between the 2.  This was done sharply with a scalpel.  Once the dissection had been carried out, the palmar fascia was divided proximally and then lifted from proximal to distal.  At the point where the neurovascular structures crossed over it, it was released free, brought under the structures, and they were allowed to thus be no longer wrapped in a spiral around the cord.  Once it was fully traced distally, with care to protect and preserve the neurovascular structure, using for probably magnification, the diseased fascia was excised and passed off on the back table.  There was good release of the digit, with the exception that the PIP still had a 20 to 30 degree flexion contracture.  With slow steady gentle manipulation, this was improved and the digit would lie in extension at this point, even at the PIP joint.  The tourniquet was released, some additional hemostasis obtained with direct pressure and the wound irrigated.  The tip of the digit pinked up nicely.  Multiple Z-plasties were then devised along the central axis, breaking  up the incision and gaining length.  These were all inset with 4-0 Vicryl Rapide interrupted suture and after closure, a bulky forearm splint dressing was applied that extends all the way to the tip of the ring and small fingers, leaving the index, long, and thumb free.  The palm and  small finger were all splinted in extension.  He was awakened and taken to the recovery room in stable condition, breathing spontaneously.  DISPOSITION: He will be discharged home today with typical instructions, returning to therapy in 3 to 7 days to have a nighttime extension splint fabricated and begin range of motion exercises, returning to see me in 10 to 15 days.

## 2018-10-20 NOTE — Interval H&P Note (Signed)
History and Physical Interval Note:  10/20/2018 7:44 AM  Gordon Stark  has presented today for surgery, with the diagnosis of RIGHT HAND DUPUYTRENS FRACTURE  The various methods of treatment have been discussed with the patient and family. After consideration of risks, benefits and other options for treatment, the patient has consented to  Procedure(s): DUPUYTREN CONTRACTURE RELEASE (Right) as a surgical intervention .  The patient's history has been reviewed, patient examined, no change in status, stable for surgery.  I have reviewed the patient's chart and labs.  Questions were answered to the patient's satisfaction.     Jodi Marbleavid A Aunesty Tyson

## 2018-10-20 NOTE — Anesthesia Postprocedure Evaluation (Signed)
Anesthesia Post Note  Patient: Gordon GuthrieStephen T Stark  Procedure(s) Performed: DUPUYTREN CONTRACTURE RELEASE (Right Hand)     Patient location during evaluation: PACU Anesthesia Type: General Level of consciousness: awake and alert Pain management: pain level controlled Vital Signs Assessment: post-procedure vital signs reviewed and stable Respiratory status: spontaneous breathing, nonlabored ventilation, respiratory function stable and patient connected to nasal cannula oxygen Cardiovascular status: blood pressure returned to baseline and stable Postop Assessment: no apparent nausea or vomiting Anesthetic complications: no    Last Vitals:  Vitals:   10/20/18 1115 10/20/18 1139  BP: 137/87 133/79  Pulse: 67 64  Resp: 15 18  Temp:  36.6 C  SpO2: 95% 96%    Last Pain:  Vitals:   10/20/18 1139  TempSrc: Oral  PainSc: 0-No pain                 Kennieth RadFitzgerald, Marycarmen Hagey E

## 2018-10-20 NOTE — Transfer of Care (Signed)
Immediate Anesthesia Transfer of Care Note  Patient: Gordon GuthrieStephen T Stark  Procedure(s) Performed: DUPUYTREN CONTRACTURE RELEASE (Right Hand)  Patient Location: PACU  Anesthesia Type:GA combined with regional for post-op pain  Level of Consciousness: awake, alert , oriented and patient cooperative  Airway & Oxygen Therapy: Patient Spontanous Breathing and Patient connected to face mask oxygen  Post-op Assessment: Report given to RN, Post -op Vital signs reviewed and stable and Patient moving all extremities  Post vital signs: Reviewed and stable  Last Vitals:  Vitals Value Taken Time  BP    Temp    Pulse 77 10/20/2018 11:03 AM  Resp 26 10/20/2018 11:03 AM  SpO2 97 % 10/20/2018 11:03 AM  Vitals shown include unvalidated device data.  Last Pain:  Vitals:   10/20/18 0725  TempSrc: Oral  PainSc: 0-No pain         Complications: No apparent anesthesia complications

## 2018-10-20 NOTE — Progress Notes (Signed)
Assisted Dr. Rob Fitzgerald with right, ultrasound guided, axillary block. Side rails up, monitors on throughout procedure. See vital signs in flow sheet. Tolerated Procedure well. 

## 2018-10-20 NOTE — Discharge Instructions (Signed)
Discharge Instructions   You have a dressing with a plaster splint incorporated in it. Move your fingers as much as possible, making a full fist and fully opening the fist. Elevate your hand to reduce pain & swelling of the digits.  Ice over the operative site may be helpful to reduce pain & swelling.  DO NOT USE HEAT. Pain medicine has been prescribed for you.  Take Tylenol 650 mg every 6 hours. Leave the dressing in place until you return to our office.  You may shower, but keep the bandage clean & dry.  You may drive a car when you are off of prescription pain medications and can safely control your vehicle with both hands. Our office will call you to arrange follow-up   Please call [phone] during normal business hours or [phone] after hours for any problems. Including the following:  - excessive redness of the incisions - drainage for more than 4 days - fever of more than 101.5 F  *Please note that pain medications will not be refilled after hours or on weekends.  Work Status for patient: Out of work until first post operative appointment. Work Status for spouse of patient: Must be out of work for 24 hours to monitor husband per anesthesia protocol.      Post Anesthesia Home Care Instructions  Activity: Get plenty of rest for the remainder of the day. A responsible individual must stay with you for 24 hours following the procedure.  For the next 24 hours, DO NOT: -Drive a car -Advertising copywriterperate machinery -Drink alcoholic beverages -Take any medication unless instructed by your physician -Make any legal decisions or sign important papers.  Meals: Start with liquid foods such as gelatin or soup. Progress to regular foods as tolerated. Avoid greasy, spicy, heavy foods. If nausea and/or vomiting occur, drink only clear liquids until the nausea and/or vomiting subsides. Call your physician if vomiting continues.  Special Instructions/Symptoms: Your throat may feel dry or  sore from the anesthesia or the breathing tube placed in your throat during surgery. If this causes discomfort, gargle with warm salt water. The discomfort should disappear within 24 hours.  If you had a scopolamine patch placed behind your ear for the management of post- operative nausea and/or vomiting:  1. The medication in the patch is effective for 72 hours, after which it should be removed.  Wrap patch in a tissue and discard in the trash. Wash hands thoroughly with soap and water. 2. You may remove the patch earlier than 72 hours if you experience unpleasant side effects which may include dry mouth, dizziness or visual disturbances. 3. Avoid touching the patch. Wash your hands with soap and water after contact with the patch.     Regional Anesthesia Blocks  1. Numbness or the inability to move the "blocked" extremity may last from 3-48 hours after placement. The length of time depends on the medication injected and your individual response to the medication. If the numbness is not going away after 48 hours, call your surgeon.  2. The extremity that is blocked will need to be protected until the numbness is gone and the  Strength has returned. Because you cannot feel it, you will need to take extra care to avoid injury. Because it may be weak, you may have difficulty moving it or using it. You may not know what position it is in without looking at it while the block is in effect.  3. For blocks in the legs and feet, returning  to weight bearing and walking needs to be done carefully. You will need to wait until the numbness is entirely gone and the strength has returned. You should be able to move your leg and foot normally before you try and bear weight or walk. You will need someone to be with you when you first try to ensure you do not fall and possibly risk injury.  4. Bruising and tenderness at the needle site are common side effects and will resolve in a few days.  5. Persistent  numbness or new problems with movement should be communicated to the surgeon or the [facility] [phone])/ [facility] [phone]).

## 2018-10-20 NOTE — Anesthesia Procedure Notes (Signed)
Anesthesia Regional Block: Axillary brachial plexus block   Pre-Anesthetic Checklist: ,, timeout performed, Correct Patient, Correct Site, Correct Laterality, Correct Procedure, Correct Position, site marked, Risks and benefits discussed,  Surgical consent,  Pre-op evaluation,  At surgeon's request and post-op pain management  Laterality: Right  Prep: chloraprep       Needles:  Injection technique: Single-shot  Needle Type: Echogenic Stimulator Needle     Needle Length: 9cm  Needle Gauge: 21     Additional Needles:   Procedures:, nerve stimulator,,, ultrasound used (permanent image in chart),,,,   Nerve Stimulator or Paresthesia:  Response: radial, median and ulnar responses noted., 0.5 mA,   Additional Responses:   Narrative:  Start time: 10/20/2018 8:04 AM End time: 10/20/2018 8:11 AM Injection made incrementally with aspirations every 5 mL.  Performed by: Personally  Anesthesiologist: Marcene DuosFitzgerald, Bernardina Cacho, MD

## 2018-10-23 ENCOUNTER — Encounter (HOSPITAL_BASED_OUTPATIENT_CLINIC_OR_DEPARTMENT_OTHER): Payer: Self-pay | Admitting: Orthopedic Surgery

## 2019-06-11 ENCOUNTER — Other Ambulatory Visit: Payer: Self-pay | Admitting: Physician Assistant

## 2019-06-11 DIAGNOSIS — I10 Essential (primary) hypertension: Secondary | ICD-10-CM
